# Patient Record
Sex: Male | Born: 1955 | Race: White | Hispanic: No | Marital: Married | Smoking: Never smoker
Health system: Southern US, Community
[De-identification: ages and names within clinical notes are randomized; demographics above are authoritative.]

## PROBLEM LIST (undated history)

## (undated) DIAGNOSIS — I10 Essential (primary) hypertension: Secondary | ICD-10-CM

## (undated) DIAGNOSIS — F419 Anxiety disorder, unspecified: Secondary | ICD-10-CM

## (undated) DIAGNOSIS — C9590 Leukemia, unspecified not having achieved remission: Secondary | ICD-10-CM

## (undated) DIAGNOSIS — E669 Obesity, unspecified: Secondary | ICD-10-CM

## (undated) DIAGNOSIS — I878 Other specified disorders of veins: Secondary | ICD-10-CM

## (undated) DIAGNOSIS — M1990 Unspecified osteoarthritis, unspecified site: Secondary | ICD-10-CM

## (undated) DIAGNOSIS — G4730 Sleep apnea, unspecified: Secondary | ICD-10-CM

## (undated) DIAGNOSIS — H9319 Tinnitus, unspecified ear: Secondary | ICD-10-CM

## (undated) DIAGNOSIS — M25569 Pain in unspecified knee: Secondary | ICD-10-CM

## (undated) DIAGNOSIS — R519 Headache, unspecified: Secondary | ICD-10-CM

## (undated) DIAGNOSIS — F32A Depression, unspecified: Secondary | ICD-10-CM

## (undated) DIAGNOSIS — R011 Cardiac murmur, unspecified: Secondary | ICD-10-CM

## (undated) DIAGNOSIS — J45909 Unspecified asthma, uncomplicated: Secondary | ICD-10-CM

## (undated) DIAGNOSIS — G8929 Other chronic pain: Secondary | ICD-10-CM

## (undated) HISTORY — DX: Pain in unspecified knee: M25.569

## (undated) HISTORY — DX: Other chronic pain: G89.29

## (undated) HISTORY — DX: Essential (primary) hypertension: I10

## (undated) HISTORY — PX: ROTATOR CUFF REPAIR: SHX139

## (undated) HISTORY — DX: Unspecified osteoarthritis, unspecified site: M19.90

## (undated) HISTORY — PX: FRACTURE SURGERY: SHX138

## (undated) HISTORY — DX: Obesity, unspecified: E66.9

## (undated) HISTORY — DX: Tinnitus, unspecified ear: H93.19

## (undated) HISTORY — DX: Leukemia, unspecified not having achieved remission: C95.90

## (undated) HISTORY — PX: ANAL FISSURE REPAIR: SHX2312

## (undated) HISTORY — DX: Other specified disorders of veins: I87.8

---

## 1995-02-26 DIAGNOSIS — C9590 Leukemia, unspecified not having achieved remission: Secondary | ICD-10-CM

## 1995-02-26 HISTORY — DX: Leukemia, unspecified not having achieved remission: C95.90

## 1996-02-26 DIAGNOSIS — M1990 Unspecified osteoarthritis, unspecified site: Secondary | ICD-10-CM

## 1996-02-26 HISTORY — DX: Unspecified osteoarthritis, unspecified site: M19.90

## 2000-06-19 ENCOUNTER — Ambulatory Visit (HOSPITAL_COMMUNITY): Admission: RE | Admit: 2000-06-19 | Discharge: 2000-06-19 | Payer: Self-pay | Admitting: Cardiology

## 2000-06-19 ENCOUNTER — Encounter: Payer: Self-pay | Admitting: Cardiology

## 2000-06-20 ENCOUNTER — Ambulatory Visit (HOSPITAL_COMMUNITY): Admission: RE | Admit: 2000-06-20 | Discharge: 2000-06-20 | Payer: Self-pay | Admitting: Cardiology

## 2000-08-08 ENCOUNTER — Ambulatory Visit (HOSPITAL_BASED_OUTPATIENT_CLINIC_OR_DEPARTMENT_OTHER): Admission: RE | Admit: 2000-08-08 | Discharge: 2000-08-08 | Payer: Self-pay | Admitting: Pulmonary Disease

## 2005-06-09 ENCOUNTER — Emergency Department (HOSPITAL_COMMUNITY): Admission: EM | Admit: 2005-06-09 | Discharge: 2005-06-09 | Payer: Self-pay | Admitting: Emergency Medicine

## 2010-05-25 ENCOUNTER — Encounter: Payer: Self-pay | Admitting: Internal Medicine

## 2010-05-25 ENCOUNTER — Encounter: Payer: Self-pay | Admitting: Family Medicine

## 2010-05-25 DIAGNOSIS — Z856 Personal history of leukemia: Secondary | ICD-10-CM | POA: Insufficient documentation

## 2010-05-25 DIAGNOSIS — C9590 Leukemia, unspecified not having achieved remission: Secondary | ICD-10-CM

## 2011-11-15 ENCOUNTER — Encounter: Payer: Self-pay | Admitting: Pulmonary Disease

## 2012-01-22 ENCOUNTER — Other Ambulatory Visit: Payer: Self-pay | Admitting: Sports Medicine

## 2012-01-22 DIAGNOSIS — M25519 Pain in unspecified shoulder: Secondary | ICD-10-CM

## 2012-01-28 ENCOUNTER — Other Ambulatory Visit: Payer: Self-pay | Admitting: Sports Medicine

## 2012-01-28 DIAGNOSIS — Z139 Encounter for screening, unspecified: Secondary | ICD-10-CM

## 2012-01-29 ENCOUNTER — Other Ambulatory Visit: Payer: Self-pay

## 2012-01-29 ENCOUNTER — Inpatient Hospital Stay: Admission: RE | Admit: 2012-01-29 | Payer: Self-pay | Source: Ambulatory Visit

## 2012-01-30 ENCOUNTER — Ambulatory Visit
Admission: RE | Admit: 2012-01-30 | Discharge: 2012-01-30 | Disposition: A | Discharge status: Home / Self Care | Payer: Managed Care, Other (non HMO) | Source: Ambulatory Visit | Attending: Sports Medicine | Admitting: Sports Medicine

## 2012-01-30 DIAGNOSIS — Z139 Encounter for screening, unspecified: Secondary | ICD-10-CM

## 2012-01-30 DIAGNOSIS — M25519 Pain in unspecified shoulder: Secondary | ICD-10-CM

## 2019-05-02 ENCOUNTER — Ambulatory Visit: Payer: Managed Care, Other (non HMO) | Attending: Internal Medicine

## 2019-05-02 DIAGNOSIS — Z23 Encounter for immunization: Secondary | ICD-10-CM | POA: Insufficient documentation

## 2019-05-02 NOTE — Progress Notes (Signed)
   Covid-19 Vaccination Clinic  Name:  VENTURA HELMES    MRN: [id number] DOB: 1956-02-09  05/02/2019  Mr. Savannah was observed post Covid-19 immunization for 15 minutes without incident. He was provided with Vaccine Information Sheet and instruction to access the V-Safe system.   Mr. Merenda was instructed to call 911 with any severe reactions post vaccine: Marland Kitchen Difficulty breathing  . Swelling of face and throat  . A fast heartbeat  . A bad rash all over body  . Dizziness and weakness   Immunizations Administered    Name Date Dose VIS Date Route   Pfizer COVID-19 Vaccine 05/02/2019  2:18 PM 0.3 mL 02/05/2019 Intramuscular   Manufacturer: [place]   Lot: [id number]   [place]: [id number]

## 2019-06-01 ENCOUNTER — Ambulatory Visit: Payer: Managed Care, Other (non HMO) | Attending: Internal Medicine

## 2019-06-01 DIAGNOSIS — Z23 Encounter for immunization: Secondary | ICD-10-CM

## 2019-06-01 NOTE — Progress Notes (Signed)
   Covid-19 Vaccination Clinic  Name:  Dakota Meyer    MRN: [id number] DOB: 1955-03-07  06/01/2019  [name] was observed post Covid-19 immunization for 15 minutes without incident. He was provided with Vaccine Information Sheet and instruction to access the V-Safe system.   [name] was instructed to call 911 with any severe reactions post vaccine: Marland Kitchen Difficulty breathing  . Swelling of face and throat  . A fast heartbeat  . A bad rash all over body  . Dizziness and weakness   Immunizations Administered    Name Date Dose VIS Date Route   Pfizer COVID-19 Vaccine 06/01/2019 12:39 PM 0.3 mL 02/05/2019 Intramuscular   Manufacturer: [place]   Lot: [id number]   [place]: [id number]

## 2019-10-27 ENCOUNTER — Other Ambulatory Visit: Payer: Self-pay | Admitting: Physician Assistant

## 2019-10-27 ENCOUNTER — Other Ambulatory Visit (HOSPITAL_COMMUNITY): Payer: Self-pay | Admitting: Physician Assistant

## 2019-10-27 DIAGNOSIS — Z808 Family history of malignant neoplasm of other organs or systems: Secondary | ICD-10-CM

## 2019-10-27 DIAGNOSIS — I878 Other specified disorders of veins: Secondary | ICD-10-CM

## 2019-11-03 ENCOUNTER — Ambulatory Visit (HOSPITAL_COMMUNITY)
Admission: RE | Admit: 2019-11-03 | Discharge: 2019-11-03 | Disposition: A | Discharge status: Home / Self Care | Payer: Medicare Other | Source: Ambulatory Visit | Attending: Physician Assistant | Admitting: Physician Assistant

## 2019-11-03 ENCOUNTER — Other Ambulatory Visit: Payer: Self-pay

## 2019-11-03 DIAGNOSIS — I878 Other specified disorders of veins: Secondary | ICD-10-CM | POA: Diagnosis not present

## 2019-11-03 DIAGNOSIS — I350 Nonrheumatic aortic (valve) stenosis: Secondary | ICD-10-CM | POA: Diagnosis not present

## 2019-11-03 DIAGNOSIS — I352 Nonrheumatic aortic (valve) stenosis with insufficiency: Secondary | ICD-10-CM

## 2019-11-03 LAB — ECHOCARDIOGRAM COMPLETE
AR max vel: 1.27 cm2
AV Area VTI: 1.21 cm2
AV Area mean vel: 1.27 cm2
AV Mean grad: 26 mmHg
AV Peak grad: 42.8 mmHg
Ao pk vel: 3.27 m/s
Area-P 1/2: 5.97 cm2
Single Plane A4C EF: 31.5 %

## 2019-11-03 NOTE — Progress Notes (Signed)
  Echocardiogram 2D Echocardiogram has been performed.  Dakota Meyer 11/03/2019, 2:34 PM

## 2019-11-09 ENCOUNTER — Encounter (HOSPITAL_COMMUNITY): Payer: Self-pay

## 2019-11-09 ENCOUNTER — Ambulatory Visit (HOSPITAL_COMMUNITY): Payer: Medicare Other

## 2020-02-01 ENCOUNTER — Other Ambulatory Visit: Payer: Self-pay | Admitting: Physician Assistant

## 2020-02-01 DIAGNOSIS — N644 Mastodynia: Secondary | ICD-10-CM

## 2020-02-20 NOTE — Progress Notes (Deleted)
Cardiology Office Note:    Date:  02/20/2020   ID:  Venson, Ferencz 1955/08/18, MRN [id number]  PCP:  Candi Leash, PA-C  Cardiologist:  No primary care provider on file.  Electrophysiologist:  None   Referring MD: Candi Leash, PA-C   No chief complaint on file. ***  History of Present Illness:    Dakota Meyer is a 64 y.o. male with a hx of chronic combined systolic and diastolic heart failure, aortic stenosis, hypertension who is referred by Candi Leash, PA for evaluation of heart failure.  Echocardiogram on 11/03/2019 showed LVEF 35 to 40%, global hypokinesis, grade 1 diastolic dysfunction, normal RV function, moderate aortic stenosis (AVA 1.2 cm, mean gradient 26 mmHg, Vmax 3.3 m/s).  Past Medical History:  Diagnosis Date  . Chronic knee pain   . Hypertension   . Leukemia ([place]) 1997  . Obesity   . Osteoarthritis 1998  . Tinnitus aurium   . Venous stasis     *** The histories are not reviewed yet. Please review them in the "History" navigator section and refresh this [place].  Current Medications: No outpatient medications have been marked as taking for the 02/24/20 encounter (Appointment) with Donato Heinz, MD.     Allergies:   Patient has no known allergies.   Social History   Socioeconomic History  . Marital status: Married    Spouse name: Not on file  . Number of children: Not on file  . Years of education: Not on file  . Highest education level: Not on file  Occupational History  . Not on file  Tobacco Use  . Smoking status: Never Smoker  . Smokeless tobacco: Not on file  Substance and Sexual Activity  . Alcohol use: Yes  . Drug use: No  . Sexual activity: Not on file  Other Topics Concern  . Not on file  Social History Narrative  . Not on file   Social Determinants of Health   Financial Resource Strain: Not on file  Food Insecurity: Not on file  Transportation Needs: Not on file  Physical Activity: Not on file   Stress: Not on file  Social Connections: Not on file     Family History: The patient's ***family history includes Aneurysm in his mother; Cancer in his father and mother.  ROS:   Please see the history of present illness.    *** All other systems reviewed and are negative.  EKGs/Labs/Other Studies Reviewed:    The following studies were reviewed today: ***  EKG:  EKG is *** ordered today.  The ekg ordered today demonstrates ***  Recent Labs: No results found for requested labs within last 8760 hours.  Recent Lipid Panel No results found for: CHOL, TRIG, HDL, CHOLHDL, VLDL, LDLCALC, LDLDIRECT  Physical Exam:    VS:  There were no vitals taken for this visit.    Wt Readings from Last 3 Encounters:  No data found for Wt     GEN: *** Well nourished, well developed in no acute distress HEENT: Normal NECK: No JVD; No carotid bruits LYMPHATICS: No lymphadenopathy CARDIAC: ***RRR, no murmurs, rubs, gallops RESPIRATORY:  Clear to auscultation without rales, wheezing or rhonchi  ABDOMEN: Soft, non-tender, non-distended MUSCULOSKELETAL:  No edema; No deformity  SKIN: Warm and dry NEUROLOGIC:  Alert and oriented x 3 PSYCHIATRIC:  Normal affect   ASSESSMENT:    No diagnosis found. PLAN:    Chronic combined systolic and diastolic heart failure:  Echocardiogram on 11/03/2019 showed LVEF  35 to 40%, global hypokinesis, grade 1 diastolic dysfunction, normal RV function, moderate AS.  Aortic stenosis: moderate aortic stenosis (AVA 1.2 cm, mean gradient 26 mmHg, Vmax 3.3 m/s) on echo 11/03/19.  Hypertension:  RTC in ***   Medication Adjustments/Labs and Tests Ordered: Current medicines are reviewed at length with the patient today.  Concerns regarding medicines are outlined above.  No orders of the defined types were placed in this encounter.  No orders of the defined types were placed in this encounter.   There are no Patient Instructions on file for this visit.    Signed, Donato Heinz, MD  02/20/2020 4:58 PM    [place]

## 2020-02-24 ENCOUNTER — Ambulatory Visit: Payer: Medicare Other | Admitting: Cardiology

## 2020-03-09 ENCOUNTER — Other Ambulatory Visit: Payer: Medicare Other

## 2020-03-09 ENCOUNTER — Inpatient Hospital Stay: Admission: RE | Admit: 2020-03-09 | Payer: Medicare Other | Source: Ambulatory Visit

## 2020-03-10 ENCOUNTER — Ambulatory Visit: Payer: Medicare Other | Admitting: Cardiology

## 2020-03-22 ENCOUNTER — Other Ambulatory Visit: Payer: Medicare Other

## 2020-03-26 NOTE — Progress Notes (Signed)
Cardiology Office Note:    Date:  03/27/2020   ID:  Dakota Meyer, Dakota Meyer 1955-12-28, MRN [id number]  PCP:  Candi Leash, PA-C  Cardiologist:  No primary care provider on file.  Electrophysiologist:  None   Referring MD: Candi Leash, PA-C   Chief Complaint  Patient presents with  . Congestive Heart Failure    History of Present Illness:    Dakota Meyer is a 65 y.o. male with a hx of leukemia, hypertension, hyperlipidemia, depression who is referred by Candi Leash, PA for evaluation of systolic heart failure.  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  He reports that he has been having dyspnea with minimal exertion.  Denies any chest pain.  Denies any lighthheadedness or syncope.  Does report he has been having lower extremity edema, uses compression stockings.  Reports rare palpitations where he feels like heart is fluttering.  Has been told he has OSA but does not use CPAP.  No smoking history.  Family history includes father had CABG in 71s.  Echocardiogram 11/03/2019 was poor quality study but showed LVEF 35 to [id number], grade 1 diastolic dysfunction, normal RV function, moderate aortic stenosis.  Past Medical History:  Diagnosis Date  . Chronic knee pain   . Hypertension   . Leukemia ([place]) 1997  . Obesity   . Osteoarthritis 1998  . Tinnitus aurium   . Venous stasis       Current Medications: Current Meds  Medication Sig  . amLODipine (NORVASC) 10 MG tablet Take 10 mg by mouth daily.  Marland Kitchen atorvastatin (LIPITOR) 20 MG tablet Take 20 mg by mouth at bedtime.  Marland Kitchen buPROPion (WELLBUTRIN XL) 150 MG 24 hr tablet Take 150 mg by mouth 2 (two) times daily.  . carvedilol (COREG) 6.25 MG tablet Take 1 tablet (6.25 mg total) by mouth 2 (two) times daily.  . cloNIDine (CATAPRES) 0.1 MG tablet Take 0.1 mg by mouth 2 (two) times daily.  . diazepam (VALIUM) 10 MG tablet 10 mg. 2 po qhs  . DULoxetine (CYMBALTA) 60 MG capsule Take 60 mg by mouth 2 (two) times daily.  .  hydrochlorothiazide (HYDRODIURIL) 25 MG tablet Take 25 mg by mouth every morning.  Marland Kitchen losartan (COZAAR) 100 MG tablet Take 100 mg by mouth daily.  . Omega-3 Fatty Acids (FISH OIL) 1200 MG CPDR Take by mouth.  . oxyCODONE (OXYCONTIN) 20 mg 12 hr tablet   . tamsulosin (FLOMAX) 0.4 MG CAPS capsule      Allergies:   Patient has no known allergies.   Social History   Socioeconomic History  . Marital status: Married    Spouse name: Not on file  . Number of children: Not on file  . Years of education: Not on file  . Highest education level: Not on file  Occupational History  . Not on file  Tobacco Use  . Smoking status: Never Smoker  . Smokeless tobacco: Never Used  Substance and Sexual Activity  . Alcohol use: Yes  . Drug use: No  . Sexual activity: Not on file  Other Topics Concern  . Not on file  Social History Narrative  . Not on file   Social Determinants of Health   Financial Resource Strain: Not on file  Food Insecurity: Not on file  Transportation Needs: Not on file  Physical Activity: Not on file  Stress: Not on file  Social Connections: Not on file     Family History: The patient's family history includes Aneurysm in  his mother; Cancer in his father and mother.  ROS:   Please see the history of present illness.     All other systems reviewed and are negative.  EKGs/Labs/Other Studies Reviewed:    The following studies were reviewed today:   EKG:  EKG is  ordered today.  The ekg ordered today demonstrates this sinus tachycardia, rate 101, LVH with repolarization abnormalities  Recent Labs: No results found for requested labs within last 8760 hours.  Recent Lipid Panel No results found for: CHOL, TRIG, HDL, CHOLHDL, VLDL, LDLCALC, LDLDIRECT  Physical Exam:    VS:  BP (!) 140/100   Pulse (!) 101   Ht 6\' 1"  (1.854 m)   Wt (!) 419 lb (190.1 kg)   SpO2 96%   BMI 55.28 kg/m     Wt Readings from Last 3 Encounters:  03/27/20 (!) 419 lb (190.1 kg)      GEN:  in no acute distress HEENT: Normal NECK: No JVD appreciated but difficult to assess given body habitus CARDIAC: RRR, 3 out of 6 systolic murmur loudest at RUSB RESPIRATORY:  Clear to auscultation without rales, wheezing or rhonchi  ABDOMEN: Soft, non-tender, non-distended MUSCULOSKELETAL:  No edema; No deformity  SKIN: Warm and dry NEUROLOGIC:  Alert and oriented x 3 PSYCHIATRIC:  Normal affect   ASSESSMENT:    1. Acute combined systolic and diastolic heart failure ([place])   2. Aortic valve stenosis, etiology of cardiac valve disease unspecified   3. Snoring   4. OSA (obstructive sleep apnea)   5. Essential hypertension   6. Hyperlipidemia, unspecified hyperlipidemia type    PLAN:    Acute combined systolic and diastolic heart failure: Echocardiogram 11/03/2019 was poor quality study but showed LVEF 35 to 33%, grade 1 diastolic dysfunction, normal RV function, moderate aortic stenosis.  He reports dyspnea with minimal exertion. -Echocardiogram appears to show systolic dysfunction, but very difficult images.  Recommend repeat limited echo with contrast to better evaluate systolic dysfunction.  If systolic dysfunction confirmed, will plan ischemia evaluation -Continue losartan 100 mg daily -BP elevated, will add carvedilol 6.25 mg twice daily -Check CMET, CBC, BNP, TSH -Schedule in pharmacy heart failure clinic in 2 weeks.  He is on multiple antihypertensives, will plan to transition these to GDMT  Aortic stenosis: Moderate on echo 11/03/2019, but poor quality study.  Plan repeat echo as above  OSA: Reports was diagnosed years ago but not on CPAP.  Suspect nontreated OSA contributing to resistant hypertension.  Will check sleep study  Hypertension: On losartan 100 mg daily, amlodipine 10 mg daily, clonidine 0.1 mg twice daily, HCTZ 25 mg daily.  -Suspect untreated OSA contributing to resistant hypertension, check sleep study as above. -Start Coreg 6.25 mg twice daily as  above -Check CMET  Hyperlipidemia: On atorvastatin 20 mg daily.  Will check lipid panel   RTC in 6 months   Medication Adjustments/Labs and Tests Ordered: Current medicines are reviewed at length with the patient today.  Concerns regarding medicines are outlined above.  Orders Placed This Encounter  Procedures  . EKG 12-Lead  . ECHOCARDIOGRAM LIMITED  . Split night study   Meds ordered this encounter  Medications  . carvedilol (COREG) 6.25 MG tablet    Sig: Take 1 tablet (6.25 mg total) by mouth 2 (two) times daily.    Dispense:  180 tablet    Refill:  3    Patient Instructions  Medication Instructions:  START carvedilol (Coreg) 6.25 mg two times daily  *If you  need a refill on your cardiac medications before your next appointment, please call your pharmacy*   Testing/Procedures: Your physician has requested that you have an echocardiogram (limited). Echocardiography is a painless test that uses sound waves to create images of your heart. It provides your doctor with information about the size and shape of your heart and how well your heart's chambers and valves are working. This procedure takes approximately one hour. There are no restrictions for this procedure. This will be done at our [facility] location:  [place] has recommended that you have a sleep study. This test records several body functions during sleep, including: brain activity, eye movement, oxygen and carbon dioxide blood levels, heart rate and rhythm, breathing rate and rhythm, the flow of air through your mouth and nose, snoring, body muscle movements, and chest and belly movement. --this must be approved by insurance prior to scheduling  Follow-Up: At [facility], you and your health needs are our priority.  As part of our continuing mission to provide you with exceptional heart care, we have created designated Provider Care Teams.  These Care Teams include your  primary Cardiologist (physician) and Advanced Practice Providers (APPs -  Physician Assistants and Nurse Practitioners) who all work together to provide you with the care you need, when you need it.  We recommend signing up for the patient portal called "MyChart".  Sign up information is provided on this After Visit Summary.  MyChart is used to connect with patients for Virtual Visits (Telemedicine).  Patients are able to view lab/test results, encounter notes, upcoming appointments, etc.  Non-urgent messages can be sent to your provider as well.   To learn more about what you can do with MyChart, go to [email or web address].    Your next appointment:   2 weeks with pharmacist-bring all medication with you  6 weeks with Dr. Gardiner Rhyme        Signed, Donato Heinz, MD  03/27/2020 6:10 PM    [place]

## 2020-03-27 ENCOUNTER — Ambulatory Visit: Payer: Medicare Other | Admitting: Cardiology

## 2020-03-27 ENCOUNTER — Telehealth: Payer: Self-pay | Admitting: *Deleted

## 2020-03-27 ENCOUNTER — Other Ambulatory Visit: Payer: Self-pay

## 2020-03-27 ENCOUNTER — Encounter: Payer: Self-pay | Admitting: Cardiology

## 2020-03-27 VITALS — BP 140/100 | HR 101 | Ht 73.0 in | Wt >= 6400 oz

## 2020-03-27 DIAGNOSIS — G4733 Obstructive sleep apnea (adult) (pediatric): Secondary | ICD-10-CM

## 2020-03-27 DIAGNOSIS — I350 Nonrheumatic aortic (valve) stenosis: Secondary | ICD-10-CM

## 2020-03-27 DIAGNOSIS — E785 Hyperlipidemia, unspecified: Secondary | ICD-10-CM

## 2020-03-27 DIAGNOSIS — R0683 Snoring: Secondary | ICD-10-CM | POA: Diagnosis not present

## 2020-03-27 DIAGNOSIS — I5041 Acute combined systolic (congestive) and diastolic (congestive) heart failure: Secondary | ICD-10-CM | POA: Diagnosis not present

## 2020-03-27 DIAGNOSIS — I10 Essential (primary) hypertension: Secondary | ICD-10-CM

## 2020-03-27 MED ORDER — CARVEDILOL 6.25 MG PO TABS: 6.2500 mg | ORAL_TABLET | Freq: Two times a day (BID) | ORAL | 3 refills | Status: DC

## 2020-03-27 NOTE — Patient Instructions (Signed)
Medication Instructions:  START carvedilol (Coreg) 6.25 mg two times daily  *If you need a refill on your cardiac medications before your next appointment, please call your pharmacy*   Testing/Procedures: Your physician has requested that you have an echocardiogram (limited). Echocardiography is a painless test that uses sound waves to create images of your heart. It provides your doctor with information about the size and shape of your heart and how well your heart's chambers and valves are working. This procedure takes approximately one hour. There are no restrictions for this procedure. This will be done at our [facility] location:  [place] has recommended that you have a sleep study. This test records several body functions during sleep, including: brain activity, eye movement, oxygen and carbon dioxide blood levels, heart rate and rhythm, breathing rate and rhythm, the flow of air through your mouth and nose, snoring, body muscle movements, and chest and belly movement. --this must be approved by insurance prior to scheduling  Follow-Up: At [facility], you and your health needs are our priority.  As part of our continuing mission to provide you with exceptional heart care, we have created designated Provider Care Teams.  These Care Teams include your primary Cardiologist (physician) and Advanced Practice Providers (APPs -  Physician Assistants and Nurse Practitioners) who all work together to provide you with the care you need, when you need it.  We recommend signing up for the patient portal called "MyChart".  Sign up information is provided on this After Visit Summary.  MyChart is used to connect with patients for Virtual Visits (Telemedicine).  Patients are able to view lab/test results, encounter notes, upcoming appointments, etc.  Non-urgent messages can be sent to your provider as well.   To learn more about what you can do with MyChart, go to  [email or web address].    Your next appointment:   2 weeks with pharmacist-bring all medication with you  6 weeks with Dr. Gardiner Rhyme

## 2020-03-27 NOTE — Telephone Encounter (Signed)
Left message to call back  Labs needed per Dr. Gardiner Rhyme (after OV 1/31)  CMET, CBC, BNP, TSH, Lipid panel-ordered placed.

## 2020-03-28 ENCOUNTER — Telehealth: Payer: Self-pay | Admitting: Cardiology

## 2020-03-28 NOTE — Telephone Encounter (Signed)
Left message to call back  

## 2020-03-28 NOTE — Telephone Encounter (Signed)
No PA Required °

## 2020-03-31 NOTE — Telephone Encounter (Signed)
Left message to call back  

## 2020-04-03 ENCOUNTER — Ambulatory Visit
Admission: RE | Admit: 2020-04-03 | Discharge: 2020-04-03 | Disposition: A | Discharge status: Home / Self Care | Payer: Medicare Other | Source: Ambulatory Visit | Attending: Physician Assistant | Admitting: Physician Assistant

## 2020-04-03 DIAGNOSIS — Z808 Family history of malignant neoplasm of other organs or systems: Secondary | ICD-10-CM

## 2020-04-10 ENCOUNTER — Other Ambulatory Visit: Payer: Self-pay

## 2020-04-10 ENCOUNTER — Ambulatory Visit (INDEPENDENT_AMBULATORY_CARE_PROVIDER_SITE_OTHER): Payer: Medicare Other | Admitting: Pharmacist

## 2020-04-10 DIAGNOSIS — I10 Essential (primary) hypertension: Secondary | ICD-10-CM

## 2020-04-10 DIAGNOSIS — I5020 Unspecified systolic (congestive) heart failure: Secondary | ICD-10-CM | POA: Diagnosis not present

## 2020-04-10 MED ORDER — ENTRESTO 49-51 MG PO TABS: 1.0000 | ORAL_TABLET | Freq: Two times a day (BID) | ORAL | 0 refills | Status: DC

## 2020-04-10 MED ORDER — CARVEDILOL 6.25 MG PO TABS
12.5000 mg | ORAL_TABLET | Freq: Two times a day (BID) | ORAL | 3 refills | Status: DC
Start: 2020-04-10 — End: 2020-05-23

## 2020-04-10 NOTE — Patient Instructions (Addendum)
Return for a  follow up appointment in 2 weeks  Go to the lab in [place] your blood pressure at home daily (if able) and keep record of the readings.  Take your BP meds as follows: *Decrease clonidine to 0.1 mg every day for 3 days, then 0.1mg  every other day for 3 days, then stop taking clonidine  *STOP taking losartan 100mg * *START taking Entresto 49-51mg  twice daily* *INCREASE carvedilol to 12.5mg  twice daily (okay to take 2 tablets of 6.25mg  until all gone)  Bring all of your meds, your BP cuff and your record of home blood pressures to your next appointment.  Exercise as you're able, try to walk approximately 30 minutes per day.  Keep salt intake to a minimum, especially watch canned and prepared boxed foods.  Eat more fresh fruits and vegetables and fewer canned items.  Avoid eating in fast food restaurants.    HOW TO TAKE YOUR BLOOD PRESSURE: . Rest 5 minutes before taking your blood pressure. .  Don't smoke or drink caffeinated beverages for at least 30 minutes before. . Take your blood pressure before (not after) you eat. . Sit comfortably with your back supported and both feet on the floor (don't cross your legs). . Elevate your arm to heart level on a table or a desk. . Use the proper sized cuff. It should fit smoothly and snugly around your bare upper arm. There should be enough room to slip a fingertip under the cuff. The bottom edge of the cuff should be 1 inch above the crease of the elbow. . Ideally, take 3 measurements at one sitting and record the average.

## 2020-04-10 NOTE — Progress Notes (Signed)
Patient ID: Dakota Meyer                 DOB: 1955/12/25                      MRN: [id number]     HPI: Dakota Meyer is a 65 y.o. male referred by Dr. Gardiner Rhyme to HTN clinic. PMH includes hypertension, hyperlipidemia, depression, OSA, leukemia, HFrEF, and venous stasis. Hypertension therapy initiated 3 years ago.  Noted chronic pain management. Current pain level 5/10 while in clinic.   Current HTN meds:  Amlodipine 10mg  daily  Carvedilol 6.25mg  twice daily Clonidine 0.1mg  twice daily HCTZ 25mg  daily losartan 100mg  daily  Intolerance: none   BP goal: <130/80  Family History: family history includes Aneurysm in his mother; Cancer in his father and mother. Father had CABG x4,   Social History: no smoking and not lots of alcohol  Diet: working on low sodium diet, working on depression as anxious eater.  Exercise: runner and football when young, very minimal physical activity at this time.  Home BP readings: none provided  Wt Readings from Last 3 Encounters:  03/27/20 (!) 419 lb (190.1 kg)   BP Readings from Last 3 Encounters:  04/10/20 132/86  03/27/20 (!) 140/100   Pulse Readings from Last 3 Encounters:  04/10/20 90  03/27/20 (!) 101    Past Medical History:  Diagnosis Date  . Chronic knee pain   . Hypertension   . Leukemia ([place]) 1997  . Obesity   . Osteoarthritis 1998  . Tinnitus aurium   . Venous stasis     Current Outpatient Medications on File Prior to Visit  Medication Sig Dispense Refill  . amLODipine (NORVASC) 10 MG tablet Take 10 mg by mouth daily.    Marland Kitchen atorvastatin (LIPITOR) 20 MG tablet Take 20 mg by mouth at bedtime.    Marland Kitchen buPROPion (WELLBUTRIN XL) 150 MG 24 hr tablet Take 150 mg by mouth 2 (two) times daily.    . Cod Liver Oil OIL Take by mouth daily.    . diazepam (VALIUM) 10 MG tablet 10 mg. 2 po qhs    . DULoxetine (CYMBALTA) 60 MG capsule Take 60 mg by mouth 2 (two) times daily.    . hydrochlorothiazide (HYDRODIURIL) 25 MG tablet Take 25 mg by  mouth every morning.    . Omega-3 Fatty Acids (FISH OIL) 1200 MG CPDR Take by mouth.    . oxyCODONE-acetaminophen (PERCOCET) 10-325 MG tablet Take 1 tablet by mouth every 4 (four) hours as needed for pain.    . tamsulosin (FLOMAX) 0.4 MG CAPS capsule Take 1 capsule by mouth daily.     No current facility-administered medications on file prior to visit.    No Known Allergies  Blood pressure 132/86, pulse 90, height 6\' 3"  (1.905 m), SpO2 93 %.  HFrEF (heart failure with reduced ejection fraction) (HCC) Blood pressure remains slightly above goal. Noted patient is on carvedilol, clonidine, losartan, and amlodipine. Medical history includes hypertension and HFrEF. Will wean-off clonidine, stop losartan, add ENTRESTO 49/51mg  to his regimen, and increase carvedilol to 12.5mg  BID. Plan to follow up in  2 weeks to continue medication titration. Goal is  to add spironolactone and Farxiga/Jardiance to therapy as needed.   Fabio Wah Rodriguez-Guzman PharmD, BCPS, [place] [address] 04/13/2020 4:38 PM

## 2020-04-11 LAB — COMPREHENSIVE METABOLIC PANEL
ALT: 12 IU/L (ref 0–44)
AST: 20 IU/L (ref 0–40)
Albumin/Globulin Ratio: 1.2 (ref 1.2–2.2)
Albumin: 3.8 g/dL (ref 3.8–4.8)
Alkaline Phosphatase: 99 IU/L (ref 44–121)
BUN/Creatinine Ratio: 21 (ref 10–24)
BUN: 19 mg/dL (ref 8–27)
Bilirubin Total: 0.3 mg/dL (ref 0.0–1.2)
CO2: 27 mmol/L (ref 20–29)
Calcium: 10 mg/dL (ref 8.6–10.2)
Chloride: 100 mmol/L (ref 96–106)
Creatinine, Ser: 0.9 mg/dL (ref 0.76–1.27)
GFR calc Af Amer: 103 mL/min/{1.73_m2} (ref >59)
GFR calc non Af Amer: 89 mL/min/{1.73_m2} (ref >59)
Globulin, Total: 3.2 g/dL (ref 1.5–4.5)
Glucose: 92 mg/dL (ref 65–99)
Potassium: 4.4 mmol/L (ref 3.5–5.2)
Sodium: 142 mmol/L (ref 134–144)
Total Protein: 7 g/dL (ref 6.0–8.5)

## 2020-04-11 LAB — LIPID PANEL
Chol/HDL Ratio: 4 ratio (ref 0.0–5.0)
Cholesterol, Total: 170 mg/dL (ref 100–199)
HDL: 43 mg/dL (ref >39)
LDL Chol Calc (NIH): 96 mg/dL (ref 0–99)
Triglycerides: 182 mg/dL — ABNORMAL HIGH (ref 0–149)
VLDL Cholesterol Cal: 31 mg/dL (ref 5–40)

## 2020-04-11 LAB — CBC
Hematocrit: 42.5 % (ref 37.5–51.0)
Hemoglobin: 14.2 g/dL (ref 13.0–17.7)
MCH: 30.9 pg (ref 26.6–33.0)
MCHC: 33.4 g/dL (ref 31.5–35.7)
MCV: 92 fL (ref 79–97)
Platelets: 259 10*3/uL (ref 150–450)
RBC: 4.6 x10E6/uL (ref 4.14–5.80)
RDW: 12.1 % (ref 11.6–15.4)
WBC: 6.9 10*3/uL (ref 3.4–10.8)

## 2020-04-11 LAB — BRAIN NATRIURETIC PEPTIDE: BNP: 99.7 pg/mL (ref 0.0–100.0)

## 2020-04-11 LAB — TSH: TSH: 1.87 u[IU]/mL (ref 0.450–4.500)

## 2020-04-13 ENCOUNTER — Encounter: Payer: Self-pay | Admitting: Pharmacist

## 2020-04-13 NOTE — Assessment & Plan Note (Addendum)
Blood pressure remains slightly above goal. Noted patient is on carvedilol, clonidine, losartan, and amlodipine. Medical history includes hypertension and HFrEF. Will wean-off clonidine, stop losartan, add ENTRESTO 49/51mg  to his regimen, and increase carvedilol to 12.5mg  BID. Plan to follow up in  2 weeks to continue medication titration. Goal is  to add spironolactone and Farxiga/Jardiance to therapy as needed.

## 2020-04-18 ENCOUNTER — Other Ambulatory Visit: Payer: Self-pay

## 2020-04-18 ENCOUNTER — Other Ambulatory Visit: Payer: Medicare Other

## 2020-04-18 ENCOUNTER — Ambulatory Visit (INDEPENDENT_AMBULATORY_CARE_PROVIDER_SITE_OTHER): Payer: Medicare Other | Admitting: Pharmacist Clinician (PhC)/ Clinical Pharmacy Specialist

## 2020-04-18 DIAGNOSIS — I5020 Unspecified systolic (congestive) heart failure: Secondary | ICD-10-CM

## 2020-04-18 MED ORDER — EPLERENONE 25 MG PO TABS: 12.5000 mg | ORAL_TABLET | Freq: Every day | ORAL | 3 refills | Status: DC

## 2020-04-18 MED ORDER — EMPAGLIFLOZIN 10 MG PO TABS: 10.0000 mg | ORAL_TABLET | Freq: Every day | ORAL | 0 refills | Status: DC

## 2020-04-18 MED ORDER — ENTRESTO 49-51 MG PO TABS: 1.0000 | ORAL_TABLET | Freq: Two times a day (BID) | ORAL | 6 refills | Status: DC

## 2020-04-18 NOTE — Assessment & Plan Note (Signed)
Patient with HFrEF currently on Entresto 49/51 and carvedilol 12.5 mg, both twice daily.   To get him fully on GDMT, will have him cut amlodipine back to 5 mg daily and add in eplerenone 12.5 mg once daily.  Will also add Jardiance 10 mg daily.  With his insurance he will have a $95 deductible, then pay $47/month for each Jardiance and Entresto.  He states this will be fine for now, but if he has difficulty with the costs, he should let us know ASAP.  He is scheduled to see Dr. Gardiner Rhyme in 3 weeks, so will have him repeat metabolic panel at that time.

## 2020-04-18 NOTE — Patient Instructions (Addendum)
Return for a a follow up appointment with Dr. Gardiner Rhyme on March 14  Go to the lab when you come in to see Dr. Gardiner Rhyme  Take your BP meds as follows:  Cut amlodipine in half from 10 mg daily to 5 mg daily  Start eplerenone 12.5 (1/2 of 25 mg tablet) once daily  Start Jardiance 10 mg once daily  Bring all of your meds, your BP cuff and your record of home blood pressures to your next appointment.  Exercise as you're able, try to walk approximately 30 minutes per day.  Keep salt intake to a minimum, especially watch canned and prepared boxed foods.  Eat more fresh fruits and vegetables and fewer canned items.  Avoid eating in fast food restaurants.    HOW TO TAKE YOUR BLOOD PRESSURE: . Rest 5 minutes before taking your blood pressure. .  Don't smoke or drink caffeinated beverages for at least 30 minutes before. . Take your blood pressure before (not after) you eat. . Sit comfortably with your back supported and both feet on the floor (don't cross your legs). . Elevate your arm to heart level on a table or a desk. . Use the proper sized cuff. It should fit smoothly and snugly around your bare upper arm. There should be enough room to slip a fingertip under the cuff. The bottom edge of the cuff should be 1 inch above the crease of the elbow. . Ideally, take 3 measurements at one sitting and record the average.

## 2020-04-18 NOTE — Progress Notes (Signed)
Patient ID: PHARAOH PIO                 DOB: 15-Nov-1955                      MRN: [id number]     HPI: Dakota Meyer is a 65 y.o. male referred by Dr. Gardiner Rhyme to HTN clinic. PMH includes hypertension, hyperlipidemia, depression, OSA, leukemia, HFrEF, and venous stasis. Hypertension therapy initiated 3 years ago.  Noted chronic pain management, now using percocet 10/325 due to insurance issues with prior oxycontin use.  He states that he will take up to 6 tablets per day, as currently he has problems with osteoarthritis in both knees as well as a strained clavicle and bad rotator cuff.    Today we discussed his HF diagnosis and the goal of getting him on GDMT.  In talking about using spironolactone, he does note that he is scheduled for a mammogram in the near future because of nipple tenderness.    Current HTN meds:  Amlodipine 10 mg daily  Carvedilol 12.5 mg twice daily HCTZ 25 mg daily Entresto 49/51 mg bid  Intolerance: none   BP goal: <130/80  Family History: family history includes Aneurysm in his mother; Cancer in his father and mother. Father had CABG x4,   Social History: no smoking and not lots of alcohol  Diet: working on low sodium diet, working on depression as anxious eater.  Exercise: runner and football when young, very minimal physical activity at this time.  Home BP readings: none provided  Wt Readings from Last 3 Encounters:  03/27/20 (!) 419 lb (190.1 kg)   BP Readings from Last 3 Encounters:  04/18/20 110/80  04/10/20 132/86  03/27/20 (!) 140/100   Pulse Readings from Last 3 Encounters:  04/18/20 91  04/10/20 90  03/27/20 (!) 101    Past Medical History:  Diagnosis Date  . Chronic knee pain   . Hypertension   . Leukemia ([place]) 1997  . Obesity   . Osteoarthritis 1998  . Tinnitus aurium   . Venous stasis     Current Outpatient Medications on File Prior to Visit  Medication Sig Dispense Refill  . amLODipine (NORVASC) 10 MG tablet Take 10 mg  by mouth daily.    Marland Kitchen atorvastatin (LIPITOR) 20 MG tablet Take 20 mg by mouth at bedtime.    Marland Kitchen buPROPion (WELLBUTRIN XL) 150 MG 24 hr tablet Take 150 mg by mouth 2 (two) times daily.    . carvedilol (COREG) 6.25 MG tablet Take 2 tablets (12.5 mg total) by mouth 2 (two) times daily. 180 tablet 3  . Cod Liver Oil OIL Take by mouth daily.    . diazepam (VALIUM) 10 MG tablet 10 mg. 2 po qhs    . hydrochlorothiazide (HYDRODIURIL) 25 MG tablet Take 25 mg by mouth every morning.    . Omega-3 Fatty Acids (FISH OIL) 1200 MG CPDR Take by mouth.    . oxyCODONE-acetaminophen (PERCOCET) 10-325 MG tablet Take 1 tablet by mouth every 4 (four) hours as needed for pain.    . tamsulosin (FLOMAX) 0.4 MG CAPS capsule Take 1 capsule by mouth daily.     No current facility-administered medications on file prior to visit.    No Known Allergies  Blood pressure 110/80, pulse 91, resp. rate 18, height 6\' 1"  (1.854 m), SpO2 94 %.  HFrEF (heart failure with reduced ejection fraction) ([place]) Patient with HFrEF currently on Entresto 49/51 and  carvedilol 12.5 mg, both twice daily.   To get him fully on GDMT, will have him cut amlodipine back to 5 mg daily and add in eplerenone 12.5 mg once daily.  Will also add Jardiance 10 mg daily.  With his insurance he will have a $95 deductible, then pay $47/month for each Jardiance and Entresto.  He states this will be fine for now, but if he has difficulty with the costs, he should let us know ASAP.  He is scheduled to see Dr. Gardiner Rhyme in 3 weeks, so will have him repeat metabolic panel at that time.     Tommy Medal PharmD CPP [facility] [address] [place] [address] 04/18/2020 4:14 PM

## 2020-04-20 ENCOUNTER — Ambulatory Visit (HOSPITAL_COMMUNITY): Payer: Medicare Other

## 2020-05-08 ENCOUNTER — Ambulatory Visit: Payer: Medicare Other | Admitting: Cardiology

## 2020-05-11 ENCOUNTER — Encounter (HOSPITAL_BASED_OUTPATIENT_CLINIC_OR_DEPARTMENT_OTHER): Payer: Medicare Other | Admitting: Cardiovascular Disease

## 2020-05-12 ENCOUNTER — Ambulatory Visit
Admission: RE | Admit: 2020-05-12 | Discharge: 2020-05-12 | Disposition: A | Discharge status: Home / Self Care | Payer: Medicare Other | Source: Ambulatory Visit | Attending: Physician Assistant | Admitting: Physician Assistant

## 2020-05-12 ENCOUNTER — Ambulatory Visit: Payer: Medicare Other

## 2020-05-12 ENCOUNTER — Other Ambulatory Visit: Payer: Self-pay

## 2020-05-12 DIAGNOSIS — N644 Mastodynia: Secondary | ICD-10-CM

## 2020-05-16 ENCOUNTER — Ambulatory Visit (HOSPITAL_COMMUNITY): Payer: Medicare Other | Attending: Cardiovascular Disease

## 2020-05-16 ENCOUNTER — Other Ambulatory Visit: Payer: Self-pay

## 2020-05-16 ENCOUNTER — Other Ambulatory Visit: Payer: Self-pay | Admitting: Cardiology

## 2020-05-16 DIAGNOSIS — I5041 Acute combined systolic (congestive) and diastolic (congestive) heart failure: Secondary | ICD-10-CM | POA: Diagnosis not present

## 2020-05-16 DIAGNOSIS — I350 Nonrheumatic aortic (valve) stenosis: Secondary | ICD-10-CM

## 2020-05-16 LAB — ECHOCARDIOGRAM COMPLETE
AR max vel: 1.89 cm2
AV Area VTI: 1.89 cm2
AV Area mean vel: 1.8 cm2
AV Mean grad: 36 mmHg
AV Peak grad: 61.5 mmHg
Ao pk vel: 3.92 m/s
Area-P 1/2: 4.15 cm2
S' Lateral: 4.4 cm

## 2020-05-16 MED ORDER — PERFLUTREN LIPID MICROSPHERE
1.0000 mL | INTRAVENOUS | Status: AC | PRN
  Administered 2020-05-16: 1 mL via INTRAVENOUS

## 2020-05-17 ENCOUNTER — Telehealth: Payer: Self-pay | Admitting: Cardiology

## 2020-05-17 NOTE — Telephone Encounter (Signed)
Returned call to patient-aware of echo results and will keep appt 3/25.  He states he is also out of Jardiance, has been out for 2 weeks.     Will send rx

## 2020-05-17 NOTE — Telephone Encounter (Signed)
Patient states he was returning phone call to get results

## 2020-05-18 NOTE — Telephone Encounter (Signed)
Well.. part of the problem is that we want to know renal function while on current therapy including Entresto,  Jardiance and eplerenoe.  Patient should repeat BMET as soon as possible to check Scr and potassium level while on Entresto and Eplerenoe.    Okay to send refill for Jardiance and he will need another BMET 2 weeks after resuming Jardiance therapy.

## 2020-05-19 ENCOUNTER — Other Ambulatory Visit: Payer: Self-pay

## 2020-05-19 ENCOUNTER — Ambulatory Visit: Payer: Medicare Other | Admitting: Cardiology

## 2020-05-19 ENCOUNTER — Encounter: Payer: Self-pay | Admitting: Cardiology

## 2020-05-19 VITALS — BP 110/72 | HR 82 | Ht 73.0 in | Wt >= 6400 oz

## 2020-05-19 DIAGNOSIS — I10 Essential (primary) hypertension: Secondary | ICD-10-CM

## 2020-05-19 DIAGNOSIS — I350 Nonrheumatic aortic (valve) stenosis: Secondary | ICD-10-CM | POA: Diagnosis not present

## 2020-05-19 DIAGNOSIS — Z01812 Encounter for preprocedural laboratory examination: Secondary | ICD-10-CM

## 2020-05-19 DIAGNOSIS — G4733 Obstructive sleep apnea (adult) (pediatric): Secondary | ICD-10-CM | POA: Diagnosis not present

## 2020-05-19 DIAGNOSIS — I5042 Chronic combined systolic (congestive) and diastolic (congestive) heart failure: Secondary | ICD-10-CM | POA: Diagnosis not present

## 2020-05-19 DIAGNOSIS — I5020 Unspecified systolic (congestive) heart failure: Secondary | ICD-10-CM

## 2020-05-19 MED ORDER — ASPIRIN EC 81 MG PO TBEC: 81.0000 mg | DELAYED_RELEASE_TABLET | Freq: Every day | ORAL | 3 refills | Status: DC

## 2020-05-19 MED ORDER — AMLODIPINE BESYLATE 10 MG PO TABS
5.0000 mg | ORAL_TABLET | Freq: Every day | ORAL | 3 refills | Status: DC
Start: 2020-05-19 — End: 2021-10-24

## 2020-05-19 NOTE — Telephone Encounter (Signed)
OV today with Dr. Gardiner Rhyme, will address in office

## 2020-05-19 NOTE — Progress Notes (Addendum)
Cardiology Office Note:    Date:  05/21/2020   ID:  Dakota Meyer, Dakota Meyer 1956/01/13, MRN [id number]  PCP:  Candi Leash, PA-C  Cardiologist:  No primary care provider on file.  Electrophysiologist:  None   Referring MD: Candi Leash, PA-C   Chief Complaint  Patient presents with  . Congestive Heart Failure    History of Present Illness:    Dakota Meyer is a 65 y.o. male with a hx of chronic combined systolic and diastolic heart failure, aortic stenosis, leukemia, hypertension, hyperlipidemia, depression who presents for follow-up.  He was referred by Candi Leash, PA for evaluation of systolic heart failure, initially seen on 03/27/2020.  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  He reports that he has been having dyspnea with minimal exertion.  Denies any chest pain.  Denies any lighthheadedness or syncope.  Does report he has been having lower extremity edema, uses compression stockings.  Reports rare palpitations where he feels like heart is fluttering.  Has been told he has OSA but does not use CPAP.  No smoking history.  Family history includes father had CABG in 56s.  Echocardiogram 11/03/2019 was poor quality study but showed LVEF 35 to 81%, grade 1 diastolic dysfunction, normal RV function, moderate aortic stenosis.  Echocardiogram on 05/16/2020 showed LVEF 30 to 27%, grade 1 diastolic dysfunction, normal RV function, mild AI, moderate to severe AS (Vmax 4.3 m/s, MG 37mmHg, AVA 1.6 cm^2).  Since last clinic visit, he denies any chest pain or shortness of breath.  Does report an episode of lightheadedness with standing, denies any syncope.  Reports has been having some lower extremity edema.  Weight is down 14 pounds from last clinic visit.  Reports he stopped drinking alcohol.  He denies any palpitations.   Wt Readings from Last 3 Encounters:  05/19/20 (!) 405 lb 9.6 oz (184 kg)  03/27/20 (!) 419 lb (190.1 kg)     Past Medical History:  Diagnosis Date  . Chronic knee  pain   . Hypertension   . Leukemia ([place]) 1997  . Obesity   . Osteoarthritis 1998  . Tinnitus aurium   . Venous stasis       Current Medications: Current Meds  Medication Sig  . aspirin EC 81 MG tablet Take 1 tablet (81 mg total) by mouth daily. Swallow whole.  Marland Kitchen atorvastatin (LIPITOR) 20 MG tablet Take 20 mg by mouth at bedtime.  Marland Kitchen buPROPion (WELLBUTRIN XL) 150 MG 24 hr tablet Take 150 mg by mouth 2 (two) times daily.  . carvedilol (COREG) 6.25 MG tablet Take 2 tablets (12.5 mg total) by mouth 2 (two) times daily.  . Cod Liver Oil OIL Take by mouth daily.  . diazepam (VALIUM) 10 MG tablet 10 mg. 2 po qhs  . empagliflozin (JARDIANCE) 10 MG TABS tablet Take 1 tablet (10 mg total) by mouth daily before breakfast.  . eplerenone (INSPRA) 25 MG tablet Take 0.5 tablets (12.5 mg total) by mouth daily.  . hydrochlorothiazide (HYDRODIURIL) 25 MG tablet Take 25 mg by mouth every morning.  . Omega-3 Fatty Acids (FISH OIL) 1200 MG CPDR Take by mouth.  . oxyCODONE-acetaminophen (PERCOCET) 10-325 MG tablet Take 1 tablet by mouth every 4 (four) hours as needed for pain.  . sacubitril-valsartan (ENTRESTO) 49-51 MG Take 1 tablet by mouth 2 (two) times daily.  . tamsulosin (FLOMAX) 0.4 MG CAPS capsule Take 1 capsule by mouth daily.  . [DISCONTINUED] amLODipine (NORVASC) 10 MG tablet Take 10  mg by mouth daily.     Allergies:   Patient has no known allergies.   Social History   Socioeconomic History  . Marital status: Married    Spouse name: Not on file  . Number of children: Not on file  . Years of education: Not on file  . Highest education level: Not on file  Occupational History  . Not on file  Tobacco Use  . Smoking status: Never Smoker  . Smokeless tobacco: Never Used  Substance and Sexual Activity  . Alcohol use: Yes  . Drug use: No  . Sexual activity: Not on file  Other Topics Concern  . Not on file  Social History Narrative  . Not on file   Social Determinants of Health    Financial Resource Strain: Not on file  Food Insecurity: Not on file  Transportation Needs: Not on file  Physical Activity: Not on file  Stress: Not on file  Social Connections: Not on file     Family History: The patient's family history includes Aneurysm in his mother; Cancer in his father and mother.  ROS:   Please see the history of present illness.     All other systems reviewed and are negative.  EKGs/Labs/Other Studies Reviewed:    The following studies were reviewed today:   EKG:  EKG is  ordered today.  The ekg ordered today demonstrates this sinus rhythm, rate 82, LVH with repolarization abnormalities  , rate 101, LVH with repolarization abnormalities  Recent Labs: 04/10/2020: ALT 12; BNP 99.7; TSH 1.870 05/19/2020: BUN 22; Creatinine, Ser 1.12; Hemoglobin 13.3; Magnesium 1.9; Platelets 215; Potassium 4.0; Sodium 141  Recent Lipid Panel    Component Value Date/Time   CHOL 170 04/10/2020 1558   TRIG 182 (H) 04/10/2020 1558   HDL 43 04/10/2020 1558   CHOLHDL 4.0 04/10/2020 1558   LDLCALC 96 04/10/2020 1558    Physical Exam:    VS:  BP 110/72   Pulse 82   Ht 6\' 1"  (1.854 m)   Wt (!) 405 lb 9.6 oz (184 kg)   SpO2 98%   BMI 53.51 kg/m     Wt Readings from Last 3 Encounters:  05/19/20 (!) 405 lb 9.6 oz (184 kg)  03/27/20 (!) 419 lb (190.1 kg)     GEN:  in no acute distress HEENT: Normal NECK: No JVD appreciated but difficult to assess given body habitus CARDIAC: RRR, 3 out of 6 systolic murmur loudest at RUSB RESPIRATORY:  Clear to auscultation without rales, wheezing or rhonchi  ABDOMEN: Soft, non-tender, non-distended MUSCULOSKELETAL:  No edema; No deformity  SKIN: Warm and dry NEUROLOGIC:  Alert and oriented x 3 PSYCHIATRIC:  Normal affect   ASSESSMENT:    1. Chronic combined systolic (congestive) and diastolic (congestive) heart failure ([place])   2. Aortic valve stenosis, etiology of cardiac valve disease unspecified   3. Essential  hypertension   4. OSA (obstructive sleep apnea)   5. Pre-procedure lab exam    PLAN:    Chronic combined systolic and diastolic heart failure: Echocardiogram 11/03/2019 was poor quality study but showed LVEF 35 to 17%, grade 1 diastolic dysfunction, normal RV function, moderate aortic stenosis.  He reports dyspnea with minimal exertion.  Repeat echocardiogram was done on 05/16/2020, with contrast given, and showed LVEF 30 to 35%, normal RV function, moderate to severe aortic stenosis. -Continue Entresto 49-51 mg twice daily.  Will check BMP, mag -Continue carvedilol 12.5 mg twice daily -Continue eplerenone 12.5 mg daily -Decrease  amlodipine to 5 mg daily, would favor weaning off amlodipine and HCTZ to continue to add GDMT.   -Recommend LHC/RHC.  Risks and benefits of cardiac catheterization have been discussed with the patient.  These include bleeding, infection, kidney damage, stroke, heart attack, death.  The patient understands these risks and is willing to proceed.  Aortic stenosis: Moderate on echo 11/03/2019, but poor quality study.  Repeat echo 05/16/20 showed moderate to severe AS (Vmax 4.3 m/s, MG 67mmHg, AVA 1.6 cm^2).  Suspect AVA is overestimated as LVOT diameter is measured at 2.7cm; at LVOT diameter 2.0 cm and VTI 24cm, AVA would be 0.9 cm^2.  Suspect severe AS. -Refer to valve clinic  OSA: Reports was diagnosed years ago but not on CPAP.  Suspect nontreated OSA contributing to resistant hypertension.  Will check sleep study  Hypertension: On Entresto, carvedilol, amlodipine, HCTZ.  Appears controlled.   -Suspect untreated OSA contributing to resistant hypertension, check sleep study as above. -Plan to transition from amlodipine and HCTZ to GDMT as above  Hyperlipidemia: On atorvastatin 20 mg daily.  LDL 96 on 04/10/2020   RTC in 6 months   Medication Adjustments/Labs and Tests Ordered: Current medicines are reviewed at length with the patient today.  Concerns regarding medicines  are outlined above.  Orders Placed This Encounter  Procedures  . Basic metabolic panel  . CBC  . Magnesium  . Ambulatory referral to Structural Heart/Valve Clinic (only at [place])  . Home sleep test   Meds ordered this encounter  Medications  . amLODipine (NORVASC) 10 MG tablet    Sig: Take 0.5 tablets (5 mg total) by mouth daily.    Dispense:  30 tablet    Refill:  3  . aspirin EC 81 MG tablet    Sig: Take 1 tablet (81 mg total) by mouth daily. Swallow whole.    Dispense:  90 tablet    Refill:  3    Patient Instructions  Medication Instructions:  DECREASE amlodipine to 5 mg daily START aspirin 81 mg daily  We will see lab results prior to restarting Jardiance.   *If you need a refill on your cardiac medications before your next appointment, please call your pharmacy*   Lab Work: BMET, CBC, Mag today  If you have labs (blood work) drawn today and your tests are completely normal, you will receive your results only by: Marland Kitchen MyChart Message (if you have MyChart) OR . A paper copy in the mail If you have any lab test that is abnormal or we need to change your treatment, we will call you to review the results.   Testing/Procedures: Home sleep study  Your physician has requested that you have a cardiac catheterization. Cardiac catheterization is used to diagnose and/or treat various heart conditions. Doctors may recommend this procedure for a number of different reasons. The most common reason is to evaluate chest pain. Chest pain can be a symptom of coronary artery disease (CAD), and cardiac catheterization can show whether plaque is narrowing or blocking your heart's arteries. This procedure is also used to evaluate the valves, as well as measure the blood flow and oxygen levels in different parts of your heart. For further information please visit HugeFiesta.tn. Please follow instruction sheet, as given.  Follow-Up: At [facility], you and your health needs are  our priority.  As part of our continuing mission to provide you with exceptional heart care, we have created designated Provider Care Teams.  These Care Teams include your primary  Cardiologist (physician) and Advanced Practice Providers (APPs -  Physician Assistants and Nurse Practitioners) who all work together to provide you with the care you need, when you need it.  We recommend signing up for the patient portal called "MyChart".  Sign up information is provided on this After Visit Summary.  MyChart is used to connect with patients for Virtual Visits (Telemedicine).  Patients are able to view lab/test results, encounter notes, upcoming appointments, etc.  Non-urgent messages can be sent to your provider as well.   To learn more about what you can do with MyChart, go to [email or web address].    Your next appointment:   1 month(s)  The format for your next appointment:   In Person  Provider:   Oswaldo Milian, MD   Other Instructions You have been referred to: Structural Heart/Valve [place] [address] Dept: [place]: [place]  05/19/2020  You are scheduled for a Cardiac Catheterization on Thursday, March 31 with Dr. Sherren Mocha.  1. Please arrive at the [facility] (Main Entrance A) at [facility] [address] [place], [place] [address] at 10:30 AM (This time is two hours before your procedure to ensure your preparation). Free valet parking service is available.   Special note: Every effort is made to have your procedure done on time. Please understand that emergencies sometimes delay scheduled procedures.  2. Diet: Do not eat solid foods after midnight.  The patient may have clear liquids until 5am upon the day of the procedure.  3. Labs: today in office  You will need a COVID-19  test prior to  your procedure. You are scheduled for Monday 3/28 at 2:20 PM. This is a Drive Up Visit at [address] [place], [place] [address]. Someone will direct you to the appropriate testing line. Stay in your car and someone will be with you shortly.  4. Medication instructions in preparation for your procedure:   Contrast Allergy: No  Hold HCTZ and Jardiance AM of procedure   On the morning of your procedure, take your Aspirin and any morning medicines NOT listed above.  You may use sips of water.  5. Plan for one night stay--bring personal belongings. 6. Bring a current list of your medications and current insurance cards. 7. You MUST have a responsible person to drive you home. 8. Someone MUST be with you the first 24 hours after you arrive home or your discharge will be delayed. 9. Please wear clothes that are easy to get on and off and wear slip-on shoes.  Thank you for allowing [place] to care for you!   -- [facility] Invasive Cardiovascular services     Signed, Donato Heinz, MD  05/21/2020 3:02 PM    [facility]

## 2020-05-19 NOTE — Patient Instructions (Addendum)
Medication Instructions:  DECREASE amlodipine to 5 mg daily START aspirin 81 mg daily  We will see lab results prior to restarting Jardiance.   *If you need a refill on your cardiac medications before your next appointment, please call your pharmacy*   Lab Work: BMET, CBC, Mag today  If you have labs (blood work) drawn today and your tests are completely normal, you will receive your results only by: Marland Kitchen MyChart Message (if you have MyChart) OR . A paper copy in the mail If you have any lab test that is abnormal or we need to change your treatment, we will call you to review the results.   Testing/Procedures: Home sleep study  Your physician has requested that you have a cardiac catheterization. Cardiac catheterization is used to diagnose and/or treat various heart conditions. Doctors may recommend this procedure for a number of different reasons. The most common reason is to evaluate chest pain. Chest pain can be a symptom of coronary artery disease (CAD), and cardiac catheterization can show whether plaque is narrowing or blocking your heart's arteries. This procedure is also used to evaluate the valves, as well as measure the blood flow and oxygen levels in different parts of your heart. For further information please visit HugeFiesta.tn. Please follow instruction sheet, as given.  Follow-Up: At [facility], you and your health needs are our priority.  As part of our continuing mission to provide you with exceptional heart care, we have created designated Provider Care Teams.  These Care Teams include your primary Cardiologist (physician) and Advanced Practice Providers (APPs -  Physician Assistants and Nurse Practitioners) who all work together to provide you with the care you need, when you need it.  We recommend signing up for the patient portal called "MyChart".  Sign up information is provided on this After Visit Summary.  MyChart is used to connect with patients for Virtual  Visits (Telemedicine).  Patients are able to view lab/test results, encounter notes, upcoming appointments, etc.  Non-urgent messages can be sent to your provider as well.   To learn more about what you can do with MyChart, go to [email or web address].    Your next appointment:   1 month(s)  The format for your next appointment:   In Person  Provider:   Oswaldo Milian, MD   Other Instructions You have been referred to: Structural Heart/Valve [place] [address] Dept: [place]: [place]  05/19/2020  You are scheduled for a Cardiac Catheterization on Thursday, March 31 with Dr. Sherren Mocha.  1. Please arrive at the [facility] (Main Entrance A) at [facility]: [address] [place], [place] [address] at 10:30 AM (This time is two hours before your procedure to ensure your preparation). Free valet parking service is available.   Special note: Every effort is made to have your procedure done on time. Please understand that emergencies sometimes delay scheduled procedures.  2. Diet: Do not eat solid foods after midnight.  The patient may have clear liquids until 5am upon the day of the procedure.  3. Labs: today in office  You will need a COVID-19  test prior to your procedure. You are scheduled for Monday 3/28 at 2:20 PM. This is a Drive Up Visit at [address] [place], [place] [address]. Someone will direct you to the appropriate testing line. Stay  in your car and someone will be with you shortly.  4. Medication instructions in preparation for your procedure:   Contrast Allergy: No  Hold HCTZ and Jardiance AM of procedure   On the morning of your procedure, take your Aspirin and any morning medicines NOT listed above.  You may use sips of water.  5. Plan for one night stay--bring personal  belongings. 6. Bring a current list of your medications and current insurance cards. 7. You MUST have a responsible person to drive you home. 8. Someone MUST be with you the first 24 hours after you arrive home or your discharge will be delayed. 9. Please wear clothes that are easy to get on and off and wear slip-on shoes.  Thank you for allowing [place] to care for you!   --  Invasive Cardiovascular services

## 2020-05-19 NOTE — H&P (View-Only) (Signed)
Cardiology Office Note:    Date:  05/21/2020   ID:  Dakota, Meyer [birth year], MRN [id number]  PCP:  Candi Leash, PA-C  Cardiologist:  No primary care provider on file.  Electrophysiologist:  None   Referring MD: Candi Leash, PA-C   Chief Complaint  Patient presents with  . Congestive Heart Failure    History of Present Illness:    Dakota Meyer is a 65 y.o. male with a hx of chronic combined systolic and diastolic heart failure, aortic stenosis, leukemia, hypertension, hyperlipidemia, depression who presents for follow-up.  He was referred by Candi Leash, PA for evaluation of systolic heart failure, initially seen on 03/27/2020.  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  He reports that he has been having dyspnea with minimal exertion.  Denies any chest pain.  Denies any lighthheadedness or syncope.  Does report he has been having lower extremity edema, uses compression stockings.  Reports rare palpitations where he feels like heart is fluttering.  Has been told he has OSA but does not use CPAP.  No smoking history.  Family history includes father had CABG in 91s.  Echocardiogram 11/03/2019 was poor quality study but showed LVEF 35 to 23%, grade 1 diastolic dysfunction, normal RV function, moderate aortic stenosis.  Echocardiogram on 05/16/2020 showed LVEF 30 to 55%, grade 1 diastolic dysfunction, normal RV function, mild AI, moderate to severe AS (Vmax 4.3 m/s, MG 63mmHg, AVA 1.6 cm^2).  Since last clinic visit, he denies any chest pain or shortness of breath.  Does report an episode of lightheadedness with standing, denies any syncope.  Reports has been having some lower extremity edema.  Weight is down 14 pounds from last clinic visit.  Reports he stopped drinking alcohol.  He denies any palpitations.   Wt Readings from Last 3 Encounters:  05/19/20 (!) 405 lb 9.6 oz (184 kg)  03/27/20 (!) 419 lb (190.1 kg)     Past Medical History:  Diagnosis Date  . Chronic knee  pain   . Hypertension   . Leukemia ([place]) 1997  . Obesity   . Osteoarthritis 1998  . Tinnitus aurium   . Venous stasis       Current Medications: Current Meds  Medication Sig  . aspirin EC 81 MG tablet Take 1 tablet (81 mg total) by mouth daily. Swallow whole.  Marland Kitchen atorvastatin (LIPITOR) 20 MG tablet Take 20 mg by mouth at bedtime.  Marland Kitchen buPROPion (WELLBUTRIN XL) 150 MG 24 hr tablet Take 150 mg by mouth 2 (two) times daily.  . carvedilol (COREG) 6.25 MG tablet Take 2 tablets (12.5 mg total) by mouth 2 (two) times daily.  . Cod Liver Oil OIL Take by mouth daily.  . diazepam (VALIUM) 10 MG tablet 10 mg. 2 po qhs  . empagliflozin (JARDIANCE) 10 MG TABS tablet Take 1 tablet (10 mg total) by mouth daily before breakfast.  . eplerenone (INSPRA) 25 MG tablet Take 0.5 tablets (12.5 mg total) by mouth daily.  . hydrochlorothiazide (HYDRODIURIL) 25 MG tablet Take 25 mg by mouth every morning.  . Omega-3 Fatty Acids (FISH OIL) 1200 MG CPDR Take by mouth.  . oxyCODONE-acetaminophen (PERCOCET) 10-325 MG tablet Take 1 tablet by mouth every 4 (four) hours as needed for pain.  . sacubitril-valsartan (ENTRESTO) 49-51 MG Take 1 tablet by mouth 2 (two) times daily.  . tamsulosin (FLOMAX) 0.4 MG CAPS capsule Take 1 capsule by mouth daily.  . [DISCONTINUED] amLODipine (NORVASC) 10 MG tablet Take 10  mg by mouth daily.     Allergies:   Patient has no known allergies.   Social History   Socioeconomic History  . Marital status: Married    Spouse name: Not on file  . Number of children: Not on file  . Years of education: Not on file  . Highest education level: Not on file  Occupational History  . Not on file  Tobacco Use  . Smoking status: Never Smoker  . Smokeless tobacco: Never Used  Substance and Sexual Activity  . Alcohol use: Yes  . Drug use: No  . Sexual activity: Not on file  Other Topics Concern  . Not on file  Social History Narrative  . Not on file   Social Determinants of Health    Financial Resource Strain: Not on file  Food Insecurity: Not on file  Transportation Needs: Not on file  Physical Activity: Not on file  Stress: Not on file  Social Connections: Not on file     Family History: The patient's family history includes Aneurysm in his mother; Cancer in his father and mother.  ROS:   Please see the history of present illness.     All other systems reviewed and are negative.  EKGs/Labs/Other Studies Reviewed:    The following studies were reviewed today:   EKG:  EKG is  ordered today.  The ekg ordered today demonstrates this sinus rhythm, rate 82, LVH with repolarization abnormalities  , rate 101, LVH with repolarization abnormalities  Recent Labs: 04/10/2020: ALT 12; BNP 99.7; TSH 1.870 05/19/2020: BUN 22; Creatinine, Ser 1.12; Hemoglobin 13.3; Magnesium 1.9; Platelets 215; Potassium 4.0; Sodium 141  Recent Lipid Panel    Component Value Date/Time   CHOL 170 04/10/2020 1558   TRIG 182 (H) 04/10/2020 1558   HDL 43 04/10/2020 1558   CHOLHDL 4.0 04/10/2020 1558   LDLCALC 96 04/10/2020 1558    Physical Exam:    VS:  BP 110/72   Pulse 82   Ht 6\' 1"  (1.854 m)   Wt (!) 405 lb 9.6 oz (184 kg)   SpO2 98%   BMI 53.51 kg/m     Wt Readings from Last 3 Encounters:  05/19/20 (!) 405 lb 9.6 oz (184 kg)  03/27/20 (!) 419 lb (190.1 kg)     GEN:  in no acute distress HEENT: Normal NECK: No JVD appreciated but difficult to assess given body habitus CARDIAC: RRR, 3 out of 6 systolic murmur loudest at RUSB RESPIRATORY:  Clear to auscultation without rales, wheezing or rhonchi  ABDOMEN: Soft, non-tender, non-distended MUSCULOSKELETAL:  No edema; No deformity  SKIN: Warm and dry NEUROLOGIC:  Alert and oriented x 3 PSYCHIATRIC:  Normal affect   ASSESSMENT:    1. Chronic combined systolic (congestive) and diastolic (congestive) heart failure ([place])   2. Aortic valve stenosis, etiology of cardiac valve disease unspecified   3. Essential  hypertension   4. OSA (obstructive sleep apnea)   5. Pre-procedure lab exam    PLAN:    Chronic combined systolic and diastolic heart failure: Echocardiogram 11/03/2019 was poor quality study but showed LVEF 35 to 57%, grade 1 diastolic dysfunction, normal RV function, moderate aortic stenosis.  He reports dyspnea with minimal exertion.  Repeat echocardiogram was done on 05/16/2020, with contrast given, and showed LVEF 30 to 35%, normal RV function, moderate to severe aortic stenosis. -Continue Entresto 49-51 mg twice daily.  Will check BMP, mag -Continue carvedilol 12.5 mg twice daily -Continue eplerenone 12.5 mg daily -Decrease  amlodipine to 5 mg daily, would favor weaning off amlodipine and HCTZ to continue to add GDMT.   -Recommend LHC/RHC.  Risks and benefits of cardiac catheterization have been discussed with the patient.  These include bleeding, infection, kidney damage, stroke, heart attack, death.  The patient understands these risks and is willing to proceed.  Aortic stenosis: Moderate on echo 11/03/2019, but poor quality study.  Repeat echo 05/16/20 showed moderate to severe AS (Vmax 4.3 m/s, MG 33mmHg, AVA 1.6 cm^2).  Suspect AVA is overestimated as LVOT diameter is measured at 2.7cm; at LVOT diameter 2.0 cm and VTI 24cm, AVA would be 0.9 cm^2.  Suspect severe AS. -Refer to valve clinic  OSA: Reports was diagnosed years ago but not on CPAP.  Suspect nontreated OSA contributing to resistant hypertension.  Will check sleep study  Hypertension: On Entresto, carvedilol, amlodipine, HCTZ.  Appears controlled.   -Suspect untreated OSA contributing to resistant hypertension, check sleep study as above. -Plan to transition from amlodipine and HCTZ to GDMT as above  Hyperlipidemia: On atorvastatin 20 mg daily.  LDL 96 on 04/10/2020   RTC in 6 months   Medication Adjustments/Labs and Tests Ordered: Current medicines are reviewed at length with the patient today.  Concerns regarding medicines  are outlined above.  Orders Placed This Encounter  Procedures  . Basic metabolic panel  . CBC  . Magnesium  . Ambulatory referral to Structural [facility]Valve Clinic (only at [place])  . Home sleep test   Meds ordered this encounter  Medications  . amLODipine (NORVASC) 10 MG tablet    Sig: Take 0.5 tablets (5 mg total) by mouth daily.    Dispense:  30 tablet    Refill:  3  . aspirin EC 81 MG tablet    Sig: Take 1 tablet (81 mg total) by mouth daily. Swallow whole.    Dispense:  90 tablet    Refill:  3    Patient Instructions  Medication Instructions:  DECREASE amlodipine to 5 mg daily START aspirin 81 mg daily  We will see lab results prior to restarting Jardiance.   *If you need a refill on your cardiac medications before your next appointment, please call your pharmacy*   Lab Work: BMET, CBC, Mag today  If you have labs (blood work) drawn today and your tests are completely normal, you will receive your results only by: Marland Kitchen MyChart Message (if you have MyChart) OR . A paper copy in the mail If you have any lab test that is abnormal or we need to change your treatment, we will call you to review the results.   Testing/Procedures: Home sleep study  Your physician has requested that you have a cardiac catheterization. Cardiac catheterization is used to diagnose and/or treat various heart conditions. Doctors may recommend this procedure for a number of different reasons. The most common reason is to evaluate chest pain. Chest pain can be a symptom of coronary artery disease (CAD), and cardiac catheterization can show whether plaque is narrowing or blocking your heart's arteries. This procedure is also used to evaluate the valves, as well as measure the blood flow and oxygen levels in different parts of your heart. For further information please visit [email or web address].[email or web address]. Please follow instruction sheet, as given.  Follow-Up: At [facility], you and your health needs are  our priority.  As part of our continuing mission to provide you with exceptional heart care, we have created designated Provider Care Teams.  These Care Teams include your primary  Cardiologist (physician) and Advanced Practice Providers (APPs -  Physician Assistants and Nurse Practitioners) who all work together to provide you with the care you need, when you need it.  We recommend signing up for the patient portal called "MyChart".  Sign up information is provided on this After Visit Summary.  MyChart is used to connect with patients for Virtual Visits (Telemedicine).  Patients are able to view lab/test results, encounter notes, upcoming appointments, etc.  Non-urgent messages can be sent to your provider as well.   To learn more about what you can do with MyChart, go to [email or web address].    Your next appointment:   1 month(s)  The format for your next appointment:   In Person  Provider:   Oswaldo Milian, MD   Other Instructions You have been referred to: Structural Heart/Valve [address] [place] [address] Dept: [place]: [place]  05/19/2020  You are scheduled for a Cardiac Catheterization on Thursday, March 31 with Dr. Sherren Mocha.  1. Please arrive at the [facility] (Main Entrance A) at [facility]: [address] [place], [place] [address] at 10:30 AM (This time is two hours before your procedure to ensure your preparation). Free valet parking service is available.   Special note: Every effort is made to have your procedure done on time. Please understand that emergencies sometimes delay scheduled procedures.  2. Diet: Do not eat solid foods after midnight.  The patient may have clear liquids until 5am upon the day of the procedure.  3. Labs: today in office  You will need a COVID-19  test prior to  your procedure. You are scheduled for Monday 3/28 at 2:20 PM. This is a Drive Up Visit at [address] [place],  [address]. Someone will direct you to the appropriate testing line. Stay in your car and someone will be with you shortly.  4. Medication instructions in preparation for your procedure:   Contrast Allergy: No  Hold HCTZ and Jardiance AM of procedure   On the morning of your procedure, take your Aspirin and any morning medicines NOT listed above.  You may use sips of water.  5. Plan for one night stay--bring personal belongings. 6. Bring a current list of your medications and current insurance cards. 7. You MUST have a responsible person to drive you home. 8. Someone MUST be with you the first 24 hours after you arrive home or your discharge will be delayed. 9. Please wear clothes that are easy to get on and off and wear slip-on shoes.  Thank you for allowing [place] to care for you!   -- [facility] Invasive Cardiovascular services     Signed, Donato Heinz, MD  05/21/2020 3:02 PM    [facility]

## 2020-05-20 LAB — BASIC METABOLIC PANEL
BUN/Creatinine Ratio: 20 (ref 10–24)
BUN: 22 mg/dL (ref 8–27)
CO2: 27 mmol/L (ref 20–29)
Calcium: 9.3 mg/dL (ref 8.6–10.2)
Chloride: 100 mmol/L (ref 96–106)
Creatinine, Ser: 1.12 mg/dL (ref 0.76–1.27)
Glucose: 97 mg/dL (ref 65–99)
Potassium: 4 mmol/L (ref 3.5–5.2)
Sodium: 141 mmol/L (ref 134–144)
eGFR: 72 mL/min/{1.73_m2} (ref >59)

## 2020-05-20 LAB — CBC
Hematocrit: 38.3 % (ref 37.5–51.0)
Hemoglobin: 13.3 g/dL (ref 13.0–17.7)
MCH: 31.6 pg (ref 26.6–33.0)
MCHC: 34.7 g/dL (ref 31.5–35.7)
MCV: 91 fL (ref 79–97)
Platelets: 215 10*3/uL (ref 150–450)
RBC: 4.21 x10E6/uL (ref 4.14–5.80)
RDW: 11.8 % (ref 11.6–15.4)
WBC: 6.1 10*3/uL (ref 3.4–10.8)

## 2020-05-20 LAB — MAGNESIUM: Magnesium: 1.9 mg/dL (ref 1.6–2.3)

## 2020-05-22 ENCOUNTER — Other Ambulatory Visit (HOSPITAL_COMMUNITY): Payer: Medicare Other

## 2020-05-23 ENCOUNTER — Telehealth: Payer: Self-pay | Admitting: *Deleted

## 2020-05-23 ENCOUNTER — Telehealth: Payer: Self-pay | Admitting: Cardiology

## 2020-05-23 ENCOUNTER — Other Ambulatory Visit: Payer: Self-pay

## 2020-05-23 ENCOUNTER — Other Ambulatory Visit (HOSPITAL_COMMUNITY)
Admission: RE | Admit: 2020-05-23 | Discharge: 2020-05-23 | Disposition: A | Discharge status: Home / Self Care | Payer: Medicare Other | Source: Ambulatory Visit | Attending: Cardiovascular Disease | Admitting: Cardiovascular Disease

## 2020-05-23 DIAGNOSIS — Z20822 Contact with and (suspected) exposure to covid-19: Secondary | ICD-10-CM | POA: Diagnosis not present

## 2020-05-23 DIAGNOSIS — Z01812 Encounter for preprocedural laboratory examination: Secondary | ICD-10-CM | POA: Insufficient documentation

## 2020-05-23 LAB — SARS CORONAVIRUS 2 (TAT 6-24 HRS): SARS Coronavirus 2: NEGATIVE

## 2020-05-23 MED ORDER — CARVEDILOL 12.5 MG PO TABS
12.5000 mg | ORAL_TABLET | Freq: Two times a day (BID) | ORAL | 1 refills | Status: DC
Start: 2020-05-23 — End: 2021-10-24

## 2020-05-23 MED ORDER — CARVEDILOL 12.5 MG PO TABS
12.5000 mg | ORAL_TABLET | Freq: Two times a day (BID) | ORAL | 1 refills | Status: DC
Start: 2020-05-23 — End: 2020-05-23

## 2020-05-23 NOTE — Telephone Encounter (Signed)
Called patient- per note from MD. Continuing Carvedilol 12.5 mg twice daily.   Patient verbalized understanding. He asked if we could adjust the RX to stay 12.5 mg twice daily- instead of the 6.25 take 2 tablets twice daily. I did update RX and sent to pharmacy.   Will route to RN to make aware of change.   Thanks!

## 2020-05-23 NOTE — Telephone Encounter (Signed)
New Message:    Pt wants to know if his Carvedilol dose will stay the same or will it be changed?

## 2020-05-23 NOTE — Telephone Encounter (Signed)
Pt contacted pre-catheterization scheduled at [facility] for: Thursday May 25, 2020 12:30 PM Verified arrival time and place: [facility]) at: 10:30 AM   No solid food after midnight prior to cath, clear liquids until 5 AM day of procedure.  Hold: [place]-AM of procedure Jardiance-AM of procedure HCTZ-AM of procedure Inspra-AM of procedure  Except hold medications AM meds can be  taken pre-cath with sips of water including: ASA 81 mg   Confirmed patient has responsible adult to drive home post procedure and be with patient first 24 hours after arriving home: yes  You are allowed ONE visitor in the waiting room during the time you are at the hospital for your procedure. Both you and your visitor must wear a mask once you enter the hospital.    Reviewed procedure/mask/visitor instructions with patient.

## 2020-05-24 ENCOUNTER — Telehealth: Payer: Self-pay | Admitting: *Deleted

## 2020-05-24 NOTE — Telephone Encounter (Signed)
Left HST appointment details on VM. 

## 2020-05-25 ENCOUNTER — Ambulatory Visit (HOSPITAL_COMMUNITY)
Admission: RE | Disposition: A | Discharge status: Home / Self Care | Payer: Self-pay | Source: Home / Self Care | Attending: Cardiovascular Disease

## 2020-05-25 ENCOUNTER — Other Ambulatory Visit: Payer: Self-pay

## 2020-05-25 ENCOUNTER — Ambulatory Visit (HOSPITAL_COMMUNITY)
Admission: RE | Admit: 2020-05-25 | Discharge: 2020-05-25 | Disposition: A | Discharge status: Home / Self Care | Payer: Medicare Other | Attending: Cardiovascular Disease | Admitting: Cardiovascular Disease

## 2020-05-25 DIAGNOSIS — I350 Nonrheumatic aortic (valve) stenosis: Secondary | ICD-10-CM

## 2020-05-25 DIAGNOSIS — G4733 Obstructive sleep apnea (adult) (pediatric): Secondary | ICD-10-CM | POA: Insufficient documentation

## 2020-05-25 DIAGNOSIS — I5042 Chronic combined systolic (congestive) and diastolic (congestive) heart failure: Secondary | ICD-10-CM | POA: Diagnosis not present

## 2020-05-25 DIAGNOSIS — E785 Hyperlipidemia, unspecified: Secondary | ICD-10-CM | POA: Insufficient documentation

## 2020-05-25 DIAGNOSIS — Z79899 Other long term (current) drug therapy: Secondary | ICD-10-CM | POA: Diagnosis not present

## 2020-05-25 DIAGNOSIS — I110 Hypertensive heart disease with heart failure: Secondary | ICD-10-CM | POA: Diagnosis not present

## 2020-05-25 DIAGNOSIS — Z7982 Long term (current) use of aspirin: Secondary | ICD-10-CM | POA: Insufficient documentation

## 2020-05-25 DIAGNOSIS — Z7984 Long term (current) use of oral hypoglycemic drugs: Secondary | ICD-10-CM | POA: Insufficient documentation

## 2020-05-25 HISTORY — PX: RIGHT/LEFT HEART CATH AND CORONARY ANGIOGRAPHY: CATH118266

## 2020-05-25 LAB — POCT I-STAT 7, (LYTES, BLD GAS, ICA,H+H)
Acid-Base Excess: 5 mmol/L — ABNORMAL HIGH (ref 0.0–2.0)
Bicarbonate: 30.9 mmol/L — ABNORMAL HIGH (ref 20.0–28.0)
Calcium, Ion: 1.29 mmol/L (ref 1.15–1.40)
HCT: 35 % — ABNORMAL LOW (ref 39.0–52.0)
Hemoglobin: 11.9 g/dL — ABNORMAL LOW (ref 13.0–17.0)
O2 Saturation: 99 %
Potassium: 3.7 mmol/L (ref 3.5–5.1)
Sodium: 140 mmol/L (ref 135–145)
TCO2: 32 mmol/L (ref 22–32)
pCO2 arterial: 51.7 mmHg — ABNORMAL HIGH (ref 32.0–48.0)
pH, Arterial: 7.384 (ref 7.350–7.450)
pO2, Arterial: 125 mmHg — ABNORMAL HIGH (ref 83.0–108.0)

## 2020-05-25 LAB — POCT I-STAT EG7
Acid-Base Excess: 5 mmol/L — ABNORMAL HIGH (ref 0.0–2.0)
Acid-Base Excess: 5 mmol/L — ABNORMAL HIGH (ref 0.0–2.0)
Bicarbonate: 30.5 mmol/L — ABNORMAL HIGH (ref 20.0–28.0)
Bicarbonate: 31.4 mmol/L — ABNORMAL HIGH (ref 20.0–28.0)
Calcium, Ion: 1.23 mmol/L (ref 1.15–1.40)
Calcium, Ion: 1.25 mmol/L (ref 1.15–1.40)
HCT: 35 % — ABNORMAL LOW (ref 39.0–52.0)
HCT: 35 % — ABNORMAL LOW (ref 39.0–52.0)
Hemoglobin: 11.9 g/dL — ABNORMAL LOW (ref 13.0–17.0)
Hemoglobin: 11.9 g/dL — ABNORMAL LOW (ref 13.0–17.0)
O2 Saturation: 77 %
O2 Saturation: 79 %
Potassium: 3.7 mmol/L (ref 3.5–5.1)
Potassium: 3.7 mmol/L (ref 3.5–5.1)
Sodium: 141 mmol/L (ref 135–145)
Sodium: 141 mmol/L (ref 135–145)
TCO2: 32 mmol/L (ref 22–32)
TCO2: 33 mmol/L — ABNORMAL HIGH (ref 22–32)
pCO2, Ven: 50.7 mmHg (ref 44.0–60.0)
pCO2, Ven: 52 mmHg (ref 44.0–60.0)
pH, Ven: 7.388 (ref 7.250–7.430)
pH, Ven: 7.388 (ref 7.250–7.430)
pO2, Ven: 43 mmHg (ref 32.0–45.0)
pO2, Ven: 44 mmHg (ref 32.0–45.0)

## 2020-05-25 SURGERY — RIGHT/LEFT HEART CATH AND CORONARY ANGIOGRAPHY
Anesthesia: LOCAL

## 2020-05-25 MED ORDER — LIDOCAINE HCL (PF) 1 % IJ SOLN
INTRAMUSCULAR | Status: DC | PRN
  Administered 2020-05-25 (×2): 2 mL

## 2020-05-25 MED ORDER — IOHEXOL 350 MG/ML SOLN
INTRAVENOUS | Status: DC | PRN
  Administered 2020-05-25: 50 mL

## 2020-05-25 MED ORDER — SODIUM CHLORIDE 0.9 % IV SOLN: INTRAVENOUS | Status: DC

## 2020-05-25 MED ORDER — LABETALOL HCL 5 MG/ML IV SOLN: 10.0000 mg | INTRAVENOUS | Status: DC | PRN

## 2020-05-25 MED ORDER — FENTANYL CITRATE (PF) 100 MCG/2ML IJ SOLN
INTRAMUSCULAR | Status: DC | PRN
  Administered 2020-05-25 (×2): 25 ug via INTRAVENOUS

## 2020-05-25 MED ORDER — HEPARIN SODIUM (PORCINE) 1000 UNIT/ML IJ SOLN
INTRAMUSCULAR | Status: AC
  Filled 2020-05-25: qty 1

## 2020-05-25 MED ORDER — ACETAMINOPHEN 325 MG PO TABS: 650.0000 mg | ORAL_TABLET | ORAL | Status: DC | PRN

## 2020-05-25 MED ORDER — HEPARIN SODIUM (PORCINE) 1000 UNIT/ML IJ SOLN
INTRAMUSCULAR | Status: DC | PRN
  Administered 2020-05-25: 8000 [IU] via INTRAVENOUS

## 2020-05-25 MED ORDER — HEPARIN (PORCINE) IN NACL 1000-0.9 UT/500ML-% IV SOLN
INTRAVENOUS | Status: AC
  Filled 2020-05-25: qty 1000

## 2020-05-25 MED ORDER — ASPIRIN 81 MG PO CHEW: 81.0000 mg | CHEWABLE_TABLET | ORAL | Status: DC

## 2020-05-25 MED ORDER — SODIUM CHLORIDE 0.9 % IV SOLN
250.0000 mL | INTRAVENOUS | Status: DC | PRN
Start: 2020-05-25 — End: 2020-05-26

## 2020-05-25 MED ORDER — HYDRALAZINE HCL 20 MG/ML IJ SOLN: 10.0000 mg | INTRAMUSCULAR | Status: DC | PRN

## 2020-05-25 MED ORDER — SODIUM CHLORIDE 0.9 % IV SOLN: 250.0000 mL | INTRAVENOUS | Status: DC | PRN

## 2020-05-25 MED ORDER — LIDOCAINE HCL (PF) 1 % IJ SOLN
INTRAMUSCULAR | Status: AC
  Filled 2020-05-25: qty 30

## 2020-05-25 MED ORDER — HEPARIN (PORCINE) IN NACL 1000-0.9 UT/500ML-% IV SOLN
INTRAVENOUS | Status: DC | PRN
  Administered 2020-05-25 (×2): 500 mL

## 2020-05-25 MED ORDER — VERAPAMIL HCL 2.5 MG/ML IV SOLN
INTRAVENOUS | Status: AC
  Filled 2020-05-25: qty 2

## 2020-05-25 MED ORDER — ONDANSETRON HCL 4 MG/2ML IJ SOLN: 4.0000 mg | Freq: Four times a day (QID) | INTRAMUSCULAR | Status: DC | PRN

## 2020-05-25 MED ORDER — MIDAZOLAM HCL 2 MG/2ML IJ SOLN
INTRAMUSCULAR | Status: AC
  Filled 2020-05-25: qty 2

## 2020-05-25 MED ORDER — FENTANYL CITRATE (PF) 100 MCG/2ML IJ SOLN
INTRAMUSCULAR | Status: AC
  Filled 2020-05-25: qty 2

## 2020-05-25 MED ORDER — SODIUM CHLORIDE 0.9% FLUSH
3.0000 mL | INTRAVENOUS | Status: DC | PRN
Start: 2020-05-25 — End: 2020-05-26

## 2020-05-25 MED ORDER — SODIUM CHLORIDE 0.9% FLUSH: 3.0000 mL | Freq: Two times a day (BID) | INTRAVENOUS | Status: DC

## 2020-05-25 MED ORDER — VERAPAMIL HCL 2.5 MG/ML IV SOLN
INTRAVENOUS | Status: DC | PRN
  Administered 2020-05-25: 10 mL via INTRA_ARTERIAL

## 2020-05-25 MED ORDER — MIDAZOLAM HCL 2 MG/2ML IJ SOLN
INTRAMUSCULAR | Status: DC | PRN
  Administered 2020-05-25 (×2): 1 mg via INTRAVENOUS

## 2020-05-25 SURGICAL SUPPLY — 13 items
CATH 5FR JL3.5 JR4 ANG PIG MP (CATHETERS) ×1 IMPLANT
CATH SWAN GANZ 7F STRAIGHT (CATHETERS) ×1 IMPLANT
DEVICE RAD COMP TR BAND LRG (VASCULAR PRODUCTS) ×1 IMPLANT
GLIDESHEATH SLEND SS 6F .021 (SHEATH) ×1 IMPLANT
GLIDESHEATH SLENDER 7FR .021G (SHEATH) ×1 IMPLANT
GUIDEWIRE ANGLED .035X150CM (WIRE) ×1 IMPLANT
GUIDEWIRE INQWIRE 1.5J.035X260 (WIRE) IMPLANT
INQWIRE 1.5J .035X260CM (WIRE) ×2
KIT HEART LEFT (KITS) ×2 IMPLANT
PACK CARDIAC CATHETERIZATION (CUSTOM PROCEDURE TRAY) ×2 IMPLANT
SHEATH PROBE COVER 6X72 (BAG) ×1 IMPLANT
TRANSDUCER W/STOPCOCK (MISCELLANEOUS) ×2 IMPLANT
TUBING CIL FLEX 10 FLL-RA (TUBING) ×2 IMPLANT

## 2020-05-25 NOTE — Addendum Note (Signed)
Addended by: Lubertha Sayres on: 05/25/2020 11:25 AM   Modules accepted: Orders

## 2020-05-25 NOTE — Discharge Instructions (Signed)
Radial Site Care  This sheet gives you information about how to care for yourself after your procedure. Your health care provider may also give you more specific instructions. If you have problems or questions, contact your health care provider. What can I expect after the procedure? After the procedure, it is common to have:  Bruising and tenderness at the catheter insertion area. Follow these instructions at home: Medicines  Take over-the-counter and prescription medicines only as told by your health care provider. Insertion site care  Follow instructions from your health care provider about how to take care of your insertion site. Make sure you: ? Wash your hands with soap and water before you change your bandage (dressing). If soap and water are not available, use hand sanitizer. ? Change your dressing as told by your health care provider. ? Leave stitches (sutures), skin glue, or adhesive strips in place. These skin closures may need to stay in place for 2 weeks or longer. If adhesive strip edges start to loosen and curl up, you may trim the loose edges. Do not remove adhesive strips completely unless your health care provider tells you to do that.  Check your insertion site every day for signs of infection. Check for: ? Redness, swelling, or pain. ? Fluid or blood. ? Pus or a bad smell. ? Warmth.  Do not take baths, swim, or use a hot tub until your health care provider approves.  You may shower 24-48 hours after the procedure, or as directed by your health care provider. ? Remove the dressing and gently wash the site with plain soap and water. ? Pat the area dry with a clean towel. ? Do not rub the site. That could cause bleeding.  Do not apply powder or lotion to the site. Activity  For 24 hours after the procedure, or as directed by your health care provider: ? Do not flex or bend the affected arm. ? Do not push or pull heavy objects with the affected arm. ? Do not drive  yourself home from the hospital or clinic. You may drive 24 hours after the procedure unless your health care provider tells you not to. ? Do not operate machinery or power tools.  Do not lift anything that is heavier than 10 lb (4.5 kg), or the limit that you are told, until your health care provider says that it is safe.  Ask your health care provider when it is okay to: ? Return to work or school. ? Resume usual physical activities or sports. ? Resume sexual activity.   General instructions  If the catheter site starts to bleed, raise your arm and put firm pressure on the site. If the bleeding does not stop, get help right away. This is a medical emergency.  If you went home on the same day as your procedure, a responsible adult should be with you for the first 24 hours after you arrive home.  Keep all follow-up visits as told by your health care provider. This is important. Contact a health care provider if:  You have a fever.  You have redness, swelling, or yellow drainage around your insertion site. Get help right away if:  You have unusual pain at the radial site.  The catheter insertion area swells very fast.  The insertion area is bleeding, and the bleeding does not stop when you hold steady pressure on the area.  Your arm or hand becomes pale, cool, tingly, or numb. These symptoms may represent a serious   problem that is an emergency. Do not wait to see if the symptoms will go away. Get medical help right away. Call your local emergency services (911 in the [place] Do not drive yourself to the hospital. Summary  After the procedure, it is common to have bruising and tenderness at the site.  Follow instructions from your health care provider about how to take care of your radial site wound. Check the wound every day for signs of infection.  Do not lift anything that is heavier than 10 lb (4.5 kg), or the limit that you are told, until your health care provider says that it  is safe. This information is not intended to replace advice given to you by your health care provider. Make sure you discuss any questions you have with your health care provider. Document Revised: 03/19/2017 Document Reviewed: 03/19/2017 Elsevier Patient Education  2021 [employer]vier Inc.  

## 2020-05-25 NOTE — Interval H&P Note (Signed)
History and Physical Interval Note:  05/25/2020 1:31 PM  Dakota Meyer  has presented today for surgery, with the diagnosis of heart failure - aortic stenosis - possible pre TAVR.  The various methods of treatment have been discussed with the patient and family. After consideration of risks, benefits and other options for treatment, the patient has consented to  Procedure(s): RIGHT/LEFT HEART CATH AND CORONARY ANGIOGRAPHY (N/A) as a surgical intervention.  The patient's history has been reviewed, patient examined, no change in status, stable for surgery.  I have reviewed the patient's chart and labs.  Questions were answered to the patient's satisfaction.     Sherren Mocha

## 2020-05-26 ENCOUNTER — Encounter (HOSPITAL_COMMUNITY): Payer: Self-pay | Admitting: Cardiovascular Disease

## 2020-05-26 MED FILL — Bupivacaine HCl Preservative Free (PF) Inj 0.25%: INTRAMUSCULAR | Qty: 30 | Status: AC

## 2020-06-01 ENCOUNTER — Other Ambulatory Visit (HOSPITAL_COMMUNITY): Payer: Self-pay | Admitting: Emergency Medicine

## 2020-06-01 DIAGNOSIS — I10 Essential (primary) hypertension: Secondary | ICD-10-CM

## 2020-06-01 MED ORDER — METOPROLOL TARTRATE 50 MG PO TABS: 50.0000 mg | ORAL_TABLET | Freq: Once | ORAL | 0 refills | Status: DC

## 2020-06-02 NOTE — Addendum Note (Signed)
Addended by: Merri Ray A on: 06/02/2020 03:44 PM   Modules accepted: Orders

## 2020-06-05 ENCOUNTER — Other Ambulatory Visit: Payer: Self-pay

## 2020-06-05 ENCOUNTER — Ambulatory Visit (HOSPITAL_COMMUNITY)
Admission: RE | Admit: 2020-06-05 | Discharge: 2020-06-05 | Disposition: A | Discharge status: Home / Self Care | Payer: Medicare Other | Source: Ambulatory Visit | Attending: Cardiovascular Disease | Admitting: Cardiovascular Disease

## 2020-06-05 DIAGNOSIS — I350 Nonrheumatic aortic (valve) stenosis: Secondary | ICD-10-CM | POA: Insufficient documentation

## 2020-06-05 MED ORDER — IOHEXOL 350 MG/ML SOLN
100.0000 mL | Freq: Once | INTRAVENOUS | Status: AC | PRN
  Administered 2020-06-05: 100 mL via INTRAVENOUS

## 2020-06-05 NOTE — Progress Notes (Signed)
VASCULAR LAB    Carotid duplex has been performed.  See CV proc for preliminary results.   Magin Balbi, RVT 06/05/2020, 9:32 AM

## 2020-06-19 ENCOUNTER — Other Ambulatory Visit: Payer: Self-pay

## 2020-06-19 ENCOUNTER — Institutional Professional Consult (permissible substitution) (INDEPENDENT_AMBULATORY_CARE_PROVIDER_SITE_OTHER): Payer: Medicare Other | Admitting: Surgery

## 2020-06-19 ENCOUNTER — Encounter: Payer: Self-pay | Admitting: Surgery

## 2020-06-19 VITALS — BP 132/84 | HR 94 | Resp 20 | Ht 73.0 in | Wt 395.0 lb

## 2020-06-19 DIAGNOSIS — I350 Nonrheumatic aortic (valve) stenosis: Secondary | ICD-10-CM | POA: Diagnosis not present

## 2020-06-19 NOTE — Progress Notes (Signed)
Patient ID: Dakota Meyer, male   DOB: November 03, 1955, 65 y.o.   MRN: [id number]  [facility] CONSULTATION REPORT  Referring Provider is Donato Heinz* Primary Cardiologist is No primary care provider on file. PCP is Candi Leash, PA-C  Chief Complaint  Patient presents with  . 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  He has fatigue and shortness of breath with light exertion such as going up a few stairs.  He has severe degenerative arthritis of both knees and needs knee replacements which decreases his mobility and increases exertion with walking.   Had no peripheral edema.  He does have dizziness with standing and has to wait a minute or two for that to clear.  Echocardiogram in September 2021 showed moderate calcification of the aortic valve with a mean gradient of 26 mmHg and a peak gradient 43 mmHg.  Dimensionless index was 0.29.  Left ventricular ejection fraction was 35 to 40% with global hypokinesis and grade 1 diastolic dysfunction.  His most recent echocardiogram on 05/16/2020 showed an increase in the mean gradient to 36 mmHg with a peak gradient of 61.5 mmHg.  Dimensionless index of 0.33.  Left ventricular ejection fraction is 30 to 35% with grade 1 diastolic dysfunction.  He subsequently underwent cardiac catheterization on 05/25/2020 which showed angiographically normal coronary arteries.  Right heart pressures were normal with high cardiac output.  The peak to peak gradient across aortic valve was 52 mmHg with a  mean gradient of 37 mmHg.  Aortic valve area was measured at 1.7 cm.  The patient is married and lives with his wife.  He is a non-smoker.  He still works part-time.  Past Medical History:  Diagnosis Date  . Chronic knee pain   . Hypertension   . Leukemia ([place]) 1997  . Obesity   . Osteoarthritis 1998  . Tinnitus aurium   . Venous stasis     Past Surgical History:  Procedure Laterality Date  . RIGHT/LEFT HEART CATH AND CORONARY ANGIOGRAPHY N/A 05/25/2020   Procedure: RIGHT/LEFT HEART CATH AND CORONARY ANGIOGRAPHY;  Surgeon: Sherren Mocha, MD;  Location: [facility];  Service: Cardiovascular;  Laterality: N/A;    Family History  Problem Relation Age of Onset  . Cancer Mother        thyroid ca  . Aneurysm Mother   . Cancer Father        lung    Social History   Socioeconomic History  . Marital status: Married    Spouse name: Not on file  . Number of children: Not on file  . Years of education: Not on file  . Highest education level: Not on file  Occupational History  . Not on file  Tobacco Use  . Smoking status: Never Smoker  . Smokeless tobacco: Never Used  Substance and Sexual Activity  . Alcohol use: Yes  . Drug use: No  . Sexual activity: Not on file  Other Topics Concern  . Not on file  Social History Narrative  . Not on file  Social Determinants of Health   Financial Resource Strain: Not on file  Food Insecurity: Not on file  Transportation Needs: Not on file  Physical Activity: Not on file  Stress: Not on file  Social Connections: Not on file  Intimate Partner Violence: Not on file    Current Outpatient Medications  Medication Sig Dispense Refill  . amLODipine (NORVASC) 10 MG tablet Take 0.5 tablets (5 mg total) by mouth daily. 30 tablet 3  . aspirin EC 81 MG tablet Take 1 tablet (81 mg total) by mouth daily. Swallow whole. 90 tablet 3  . atorvastatin (LIPITOR) 20 MG tablet Take 20 mg by mouth at bedtime.    Marland Kitchen buPROPion (WELLBUTRIN XL)  150 MG 24 hr tablet Take 150 mg by mouth 2 (two) times daily.    . carvedilol (COREG) 12.5 MG tablet Take 1 tablet (12.5 mg total) by mouth 2 (two) times daily. 180 tablet 1  . Cod Liver Oil OIL Take 1 capsule by mouth daily. 375 mg vitamin A 415 mg cod liver oil    . diazepam (VALIUM) 10 MG tablet Take 10 mg by mouth daily as needed for anxiety.    . DULoxetine (CYMBALTA) 60 MG capsule Take 60 mg by mouth daily.    . empagliflozin (JARDIANCE) 10 MG TABS tablet Take 1 tablet (10 mg total) by mouth daily before breakfast. 21 tablet 0  . eplerenone (INSPRA) 25 MG tablet Take 0.5 tablets (12.5 mg total) by mouth daily. 15 tablet 3  . hydrochlorothiazide (HYDRODIURIL) 25 MG tablet Take 25 mg by mouth every morning.    . Omega-3 Fatty Acids (FISH OIL) 1200 MG CPDR Take 1,200 mg by mouth daily.    Marland Kitchen OVER THE COUNTER MEDICATION Take 1 capsule by mouth daily. Go-Out    . oxyCODONE-acetaminophen (PERCOCET) 10-325 MG tablet Take 2 tablets by mouth 3 (three) times daily as needed for pain.    . sacubitril-valsartan (ENTRESTO) 49-51 MG Take 1 tablet by mouth 2 (two) times daily. 60 tablet 6  . Semaglutide (RYBELSUS) 3 MG TABS Take 3 mg by mouth daily before breakfast.    . tamsulosin (FLOMAX) 0.4 MG CAPS capsule Take 0.4 mg by mouth daily.    . metoprolol tartrate (LOPRESSOR) 50 MG tablet Take 1 tablet (50 mg total) by mouth once for 1 dose. Please take 2 hr prior to CT scans 1 tablet 0   No current facility-administered medications for this visit.    No Known Allergies    Review of Systems:   General:  normal appetite, + decreased energy, no weight gain, + weight loss, no fever  Cardiac:  no chest pain with exertion, no chest pain at rest, +SOB with mild exertion, no resting SOB, no PND, + orthopnea, no palpitations, + arrhythmia, no atrial fibrillation, + LE edema, + dizzy spells, no syncope  Respiratory:  + exertional shortness of breath, no home oxygen, no productive cough, no dry cough, no  bronchitis, + wheezing, no hemoptysis, no asthma, no pain with inspiration or cough, no sleep apnea, no CPAP at night  GI:   no difficulty swallowing, no reflux, no frequent heartburn, no hiatal hernia, no abdominal pain, + constipation, no diarrhea, no hematochezia, no hematemesis, no melena  GU:   no dysuria,  + frequency, no urinary tract infection, no hematuria, + enlarged prostate, no kidney stones, no kidney disease  Vascular:  no pain suggestive of claudication, + pain in feet, + leg cramps, no varicose veins, no DVT, no non-healing  foot ulcer  Neuro:   no stroke, no TIA's, no seizures, + headaches, no temporary blindness one eye,  no slurred speech, no peripheral neuropathy, + chronic pain, + instability of gait, no memory/cognitive dysfunction  Musculoskeletal: + arthritis, + joint swelling, no myalgias, + difficulty walking, + reduced mobility   Skin:   no rash, no itching, no skin infections, no pressure sores or ulcerations  Psych:   + anxiety, + depression, + nervousness, + unusual recent stress  Eyes:   no blurry vision, + floaters, no recent vision changes, + wears glasses or contacts  ENT:   + hearing loss, no loose or painful teeth, no dentures, last saw dentist 01/2019  Hematologic:  no easy bruising, no abnormal bleeding, no clotting disorder, no frequent epistaxis  Endocrine:  no diabetes, does not check CBG's at home     Physical Exam:   BP 132/84 (BP Location: Left Arm, Patient Position: Sitting, Cuff Size: Large) Comment (BP Location): forearm  Pulse 94   Resp 20   Ht 6\' 1"  (1.854 m)   Wt (!) 395 lb (179.2 kg)   SpO2 96% Comment: RA  BMI 52.11 kg/m   General:  Morbidly obese,  well-appearing, walks with cane  HEENT:  Unremarkable, NCAT, PERLA, EOMI  Neck:   no JVD, no bruits, no adenopathy   Chest:   clear to auscultation, symmetrical breath sounds, no wheezes, no rhonchi   CV:   RRR, grade lll/VI crescendo/decrescendo murmur heard best at RSB,  no diastolic  murmur  Abdomen:  soft, non-tender, no masses   Extremities:  warm, well-perfused, pulses palpable in feet, no LE edema  Rectal/GU  Deferred  Neuro:   Grossly non-focal and symmetrical throughout  Skin:   Clean and dry, no rashes, no breakdown   Diagnostic Tests:  ECHOCARDIOGRAM REPORT       Patient Name:  KEONA SHEFFLER Date of Exam: 05/16/2020  Medical Rec #: [id number]    Height:    73.0 in  Accession #:  [id number]   Weight:    419.0 lb  Date of Birth: [birth year]    BSA:     2.946 m  Patient Age:  28 years    BP:      110/80 mmHg  Patient Gender: M        HR:      84 bpm.  Exam Location: [address]   Procedure: 2D Echo, Cardiac Doppler, Color Doppler and Intracardiac       Opacification Agent   Indications:  I50.41 CHF    History:    Patient has prior history of Echocardiogram examinations.  CHF;         Risk Factors:Hypertension, Dyslipidemia and Sleep Apnea.         Leukemia.    Sonographer:  Jessee Avers, RDCS  Referring Phys: [id number] [place]     Sonographer Comments: Technically difficult study due to poor echo windows  and suboptimal apical window. Image acquisition challenging due to patient  body habitus.  IMPRESSIONS    1. Left ventricular ejection fraction, by estimation, is 30 to 35%. The  left ventricle has moderately decreased function. The left ventricle  demonstrates regional wall motion abnormalities (see scoring  diagram/findings for description). Left ventricular  diastolic parameters are consistent with Grade I diastolic dysfunction  (impaired relaxation).  2. Right ventricular systolic function is normal. The right ventricular  size is normal.  3. The mitral valve is normal in structure. No evidence  of mitral valve  regurgitation.  4. The aortic valve is calcified. There is moderate calcification of the  aortic valve. There is  moderate thickening of the aortic valve. Aortic  valve regurgitation is mild. Moderate to severe aortic valve stenosis.   FINDINGS  Left Ventricle: Left ventricular ejection fraction, by estimation, is 30  to 35%. The left ventricle has moderately decreased function. The left  ventricle demonstrates regional wall motion abnormalities. Severe  hypokinesis of the left ventricular,  mid-apical anteroseptal wall, lateral wall and apical segment. Definity  contrast agent was given IV to delineate the left ventricular endocardial  borders. Left ventricular diastolic parameters are consistent with Grade I  diastolic dysfunction (impaired  relaxation).   Right Ventricle: The right ventricular size is normal. Right ventricular  systolic function is normal.   Left Atrium: Left atrial size was normal in size.   Right Atrium: Right atrial size was normal in size.   Mitral Valve: The mitral valve is normal in structure.   Tricuspid Valve: The tricuspid valve is normal in structure. Tricuspid  valve regurgitation is not demonstrated.   Aortic Valve: The aortic valve is calcified. There is moderate  calcification of the aortic valve. There is moderate thickening of the  aortic valve. There is moderate aortic valve annular calcification. Aortic  valve regurgitation is mild. Moderate to  severe aortic stenosis is present. Aortic valve mean gradient measures  36.0 mmHg. Aortic valve peak gradient measures 61.5 mmHg. Aortic valve  area, by VTI measures 1.89 cm.   IAS/Shunts: The atrial septum is grossly normal.   LEFT VENTRICLE  PLAX 2D  LVIDd:     6.70 cm Diastology  LVIDs:     4.40 cm LV e' medial:  3.85 cm/s  LV PW:     1.10 cm LV E/e' medial: 14.2  LV IVS:    1.00 cm LV e' lateral:  11.10 cm/s  LVOT diam:   2.70 cm LV E/e' lateral: 4.9  LV SV:     150  LV SV Index:  51  LVOT Area:   5.73 cm     RIGHT VENTRICLE  RV Basal diam: 3.50 cm  RV S  prime:   8.31 cm/s  TAPSE (M-mode): 2.6 cm   LEFT ATRIUM       Index    RIGHT ATRIUM      Index  LA diam:    4.60 cm 1.56 cm/m RA Pressure: 3.00 mmHg  LA Vol (A2C):  60.3 ml 20.47 ml/m RA Area:   14.20 cm  LA Vol (A4C):  57.3 ml 19.45 ml/m RA Volume:  33.70 ml 11.44 ml/m  LA Biplane Vol: 58.8 ml 19.96 ml/m  AORTIC VALVE  AV Area (Vmax):  1.89 cm  AV Area (Vmean):  1.80 cm  AV Area (VTI):   1.89 cm  AV Vmax:      392.25 cm/s  AV Vmean:     278.750 cm/s  AV VTI:      0.792 m  AV Peak Grad:   61.5 mmHg  AV Mean Grad:   36.0 mmHg  LVOT Vmax:     129.50 cm/s  LVOT Vmean:    87.450 cm/s  LVOT VTI:     0.262 m  LVOT/AV VTI ratio: 0.33    AORTA  Ao Root diam: 3.60 cm   MITRAL VALVE        TRICUSPID VALVE               Estimated RAP:  3.00 mmHg    MV E velocity: 54.80 cm/s SHUNTS  MV A velocity: 90.00 cm/s Systemic VTI: 0.26 m  MV E/A ratio: 0.61    Systemic Diam: 2.70 cm   Mertie Moores MD  Electronically signed by Mertie Moores MD  Signature Date/Time: 05/16/2020/3:03:42 PM     Physicians  Panel Physicians Referring Physician Case Authorizing Physician  Sherren Mocha, MD (Primary)      Procedures  RIGHT/LEFT HEART CATH AND CORONARY ANGIOGRAPHY   Conclusion  1.  Angiographically normal coronary arteries (left dominant) 2.  Essentially normal right heart pressures and hemodynamics with high cardiac output 3.  Calcified, restricted aortic valve with a peak to peak gradient 52 mmHg, mean gradient 37 mmHg, calculated aortic valve area 1.7 cm.  Recommendations: Suspected severe aortic stenosis, transvalvular gradients are in the severe range and valve area is likely erroneous with this patient's high cardiac output.  Recommend multidisciplinary heart team evaluation for consideration of TAVR versus surgical AVR.   Indications  Severe aortic stenosis [I35.0  (ICD-10-CM)]   Procedural Details  Technical Details INDICATION: CHF, aortic stenosis. 400 pound gentleman with CHF, LV dysfunction, and what appears to be severe aortic stenosis (echo imaging difficult). He is referred for R/L heart cath.  PROCEDURAL DETAILS: Using direct ultrasound guidance, an antecubital vein is accessed successfully with a micropuncture needle.  A 7 French sheath is inserted.  Ultrasound images are digitally captured and stored in the patient's chart.  The right wrist was then prepped, draped, and anesthetized with 1% lidocaine. Using ultrasound guidance and the modified Seldinger technique a 5/6 French Slender sheath was placed in the right radial artery. Intra-arterial verapamil was administered through the radial artery sheath. IV heparin was administered after a JR4 catheter was advanced into the central aorta. A Swan-Ganz catheter was used for the right heart catheterization. Standard protocol was followed for recording of right heart pressures and sampling of oxygen saturations. Fick cardiac output was calculated. Standard Judkins catheters were used for selective coronary angiography. LV pressure is recorded and an aortic valve pullback is performed. There were no immediate procedural complications. The patient was transferred to the post catheterization recovery area for further monitoring.    Estimated blood loss <50 mL.   During this procedure medications were administered to achieve and maintain moderate conscious sedation while the patient's heart rate, blood pressure, and oxygen saturation were continuously monitored and I was present face-to-face 100% of this time.   Medications (Filter: Administrations occurring from 1258 to 1411 on 05/25/20) (important) Continuous medications are totaled by the amount administered until 05/25/20 1411.    Heparin (Porcine) in NaCl 1000-0.9 UT/500ML-% SOLN (mL) Total volume:  1,000 mL  Date/Time Rate/Dose/Volume Action    05/25/20 1314 500 mL Given   1314 500 mL Given    midazolam (VERSED) injection (mg) Total dose:  2 mg  Date/Time Rate/Dose/Volume Action   05/25/20 1319 1 mg Given   1335 1 mg Given    fentaNYL (SUBLIMAZE) injection (mcg) Total dose:  50 mcg  Date/Time Rate/Dose/Volume Action   05/25/20 1319 25 mcg Given   1335 25 mcg Given    lidocaine (PF) (XYLOCAINE) 1 % injection (mL) Total volume:  4 mL  Date/Time Rate/Dose/Volume Action   05/25/20 1336 2 mL Given   1338 2 mL Given    Radial Cocktail/Verapamil only (mL) Total volume:  10 mL  Date/Time Rate/Dose/Volume Action   05/25/20 1338 10 mL Given    heparin sodium (porcine) injection (Units) Total  dose:  8,000 Units  Date/Time Rate/Dose/Volume Action   05/25/20 1348 8,000 Units Given    iohexol (OMNIPAQUE) 350 MG/ML injection (mL) Total volume:  50 mL  Date/Time Rate/Dose/Volume Action   05/25/20 1359 50 mL Given    Contrast  Medication Name Total Dose  iohexol (OMNIPAQUE) 350 MG/ML injection 50 mL    Radiation/Fluoro  Fluoro time: 6.3 (min) DAP: 27.8 (Gycm2) Cumulative Air Kerma: 362.6 (mGy)   Coronary Findings   Diagnostic Dominance: Left  Left Main  Vessel is large.  Left Anterior Descending  Vessel is angiographically normal.  Right Coronary Artery  Vessel is small.   Intervention   No interventions have been documented.  Left Heart  Aortic Valve There is severe aortic valve stenosis. The aortic valve is calcified. There is restricted aortic valve motion.   Coronary Diagrams   Diagnostic Dominance: Left    Intervention    Implants    No implant documentation for this case.   Syngo Images  Show images for CARDIAC CATHETERIZATION  Images on Long Term Storage  Show images for [place], [place] to Procedure Log  Procedure Log     Hemo Data  Flowsheet Row Most Recent Value  Fick Cardiac Output 9.69 L/min  Fick Cardiac Output Index 3.34 (L/min)/BSA  Aortic  Mean Gradient 37.43 mmHg  Aortic Peak Gradient 52 mmHg  Aortic Valve Area 1.70  Aortic Value Area Index 0.59 cm2/BSA  RA A Wave 6 mmHg  RA V Wave 6 mmHg  RA Mean 4 mmHg  RV Systolic Pressure 41 mmHg  RV Diastolic Pressure 5 mmHg  RV EDP 9 mmHg  PA Systolic Pressure 31 mmHg  PA Diastolic Pressure 18 mmHg  PA Mean 22 mmHg  PW A Wave 19 mmHg  PW V Wave 17 mmHg  PW Mean 16 mmHg  AO Systolic Pressure [id number] mmHg  AO Diastolic Pressure 62 mmHg  AO Mean 82 mmHg  LV Systolic Pressure [id number] mmHg  LV Diastolic Pressure 6 mmHg  LV EDP 10 mmHg  AOp Systolic Pressure 83 mmHg  AOp Diastolic Pressure 62 mmHg  AOp Mean Pressure 72 mmHg  LVp Systolic Pressure [id number] mmHg  LVp Diastolic Pressure 8 mmHg  LVp EDP Pressure 15 mmHg  QP/QS 1.1  TPVR Index 6.58 HRUI  TSVR Index 24.54 HRUI  PVR SVR Ratio 0.08  TPVR/TSVR Ratio 0.27   ADDENDUM REPORT: 06/07/2020 23:44  CLINICAL DATA:  89 -year-old male with severe aortic stenosis being evaluated for a TAVR procedure.  EXAM: Cardiac TAVR CT  TECHNIQUE: The patient was scanned on a [employer]. A 120 kV retrospective scan was triggered in the descending thoracic aorta at 111 HU's. Gantry rotation speed was 250 msecs and collimation was .6 mm. No beta blockade or nitro were given. The 3D data set was reconstructed in 5% intervals of the R-R cycle. Systolic and diastolic phases were analyzed on a dedicated work station using MPR, MIP and VRT modes. The patient received 80 cc of contrast.  FINDINGS: Aortic Root:  Aortic valve: Trileaflet  Aortic valve calcium score: 3409  Aortic annulus:  Diameter: 5mm x 40mm  Perimeter: 14mm  Area: 664 mm^2  Calcifications: Mild calcification adjacent to [place]  Coronary height: Min Left - 32mm, Max Left - 41mm; Min Right - 52mm  Sinotubular height: Left cusp - 5mm; Right cusp - 41mm; Noncoronary cusp - 58mm  LVOT (as measured 3 mm below the annulus):  Diameter: 26mm x  22mm  Area: 734  mm^2  Calcifications: No calcifications  Aortic sinus width: Left cusp - 90mm; Right cusp - 34mm; Noncoronary cusp - 50mm  Sinotubular junction width: 49mm x 41mm  Optimum Fluoroscopic Angle for Delivery: RAO 30 CAU 8  Cardiac:  Right atrium: Normal size  Right ventricle: Normal size  Pulmonary arteries: Normal size  Pulmonary veins: Normal configuration  Left atrium: Moderate enlargement  Left ventricle: Moderately dilated  Pericardium: Normal thickness  Coronary arteries: Calcium score 195 (64th percentile)  IMPRESSION: 1. Poor image quality, likely due to morbid obesity causing high image noise  2. Trileaflet aortic valve with severe calcifications (AV calcium score 3409)  3. Aortic annulus measures 34mm x 69mm in diameter with perimeter 68mm and area 664 mm^2. Mild annular calcifications adjacent to [place]. Annular measurements suitable for delivery of 85mm Edwards Sapien 3 valve  4. Sufficient coronary to annulus distance, measuring 39mm to left main and 81mm to RCA  5.  Optimum Fluoroscopic Angle for Delivery: RAO 30 CAU 8  6.  Coronary calcium score 195 (64th percentile)   Electronically Signed   By: Oswaldo Milian MD   On: 06/07/2020 23:44   Addended by Donato Heinz, MD on 06/07/2020 11:46 PM    Study Result  Narrative & Impression  EXAM: OVER-READ INTERPRETATION  CT CHEST  The following report is an over-read performed by radiologist Dr. Vinnie Langton of [facility], [place] on 06/05/2020. This over-read does not include interpretation of cardiac or coronary anatomy or pathology. The coronary calcium score/coronary CTA interpretation by the cardiologist is attached.  COMPARISON:  None.  FINDINGS: Extracardiac findings will be described separately under dictation for contemporaneously obtained CTA chest, abdomen and pelvis.  IMPRESSION: Please see separate dictation for  contemporaneously obtained CTA chest, abdomen and pelvis dated 06/05/2020 for full description of relevant extracardiac findings.  Electronically Signed: By: Vinnie Langton M.D. On: 06/05/2020 11:56     Narrative & Impression  CLINICAL DATA:  65 year old male with history of severe aortic stenosis. Preprocedural study prior to potential transcatheter aortic valve replacement (TAVR) procedure.  EXAM: CT ANGIOGRAPHY CHEST, ABDOMEN AND PELVIS  TECHNIQUE: Non-contrast CT of the chest was initially obtained.  Multidetector CT imaging through the chest, abdomen and pelvis was performed using the standard protocol during bolus administration of intravenous contrast. Multiplanar reconstructed images and MIPs were obtained and reviewed to evaluate the vascular anatomy.  CONTRAST:  182mL OMNIPAQUE IOHEXOL 350 MG/ML SOLN  COMPARISON:  No priors.  FINDINGS: CTA CHEST FINDINGS  Cardiovascular: Heart size is borderline enlarged with concentric left ventricular hypertrophy. There is no significant pericardial fluid, thickening or pericardial calcification. There is aortic atherosclerosis, as well as atherosclerosis of the great vessels of the mediastinum and the coronary arteries, including calcified atherosclerotic plaque in the left anterior descending and left circumflex coronary arteries. Severe calcifications of the aortic valve.  Mediastinum/Lymph Nodes: No pathologically enlarged mediastinal or hilar lymph nodes. Please note that accurate exclusion of hilar adenopathy is limited on noncontrast CT scans. Esophagus is unremarkable in appearance. No axillary lymphadenopathy.  Lungs/Pleura: No acute consolidative airspace disease. No pleural effusions. No suspicious appearing pulmonary nodules or masses are noted. Areas of lucency interspersed with areas of ground-glass attenuation with intervening geographic margins, strongly suggestive of air trapping from small  airways disease.  Musculoskeletal/Soft Tissues: There are no aggressive appearing lytic or blastic lesions noted in the visualized portions of the skeleton.  CTA ABDOMEN AND PELVIS FINDINGS  Hepatobiliary: No suspicious cystic or solid hepatic lesions. No intra  or extrahepatic biliary ductal dilatation. Mild diffuse low attenuation throughout the hepatic parenchyma, indicative of hepatic steatosis. Status post cholecystectomy.  Pancreas: No pancreatic mass. No pancreatic ductal dilatation. No pancreatic or peripancreatic fluid collections or inflammatory changes.  Spleen: Calcified granulomas in the spleen. Otherwise, unremarkable.  Adrenals/Urinary Tract: Bilateral kidneys and adrenal glands are normal in appearance. No hydroureteronephrosis. Urinary bladder is normal in appearance.  Stomach/Bowel: The appearance of the stomach is normal. No pathologic dilatation of small bowel or colon. Normal appendix.  Vascular/Lymphatic: Aortic atherosclerosis, without evidence of aneurysm or dissection in the abdominal or pelvic vasculature. Vascular findings and measurements pertinent to potential TAVR procedure, as detailed below. No lymphadenopathy noted in the abdomen or pelvis.  Reproductive: Prostate gland and seminal vesicles are unremarkable in appearance.  Other: No significant volume of ascites.  No pneumoperitoneum.  Musculoskeletal: There are no aggressive appearing lytic or blastic lesions noted in the visualized portions of the skeleton.  VASCULAR MEASUREMENTS PERTINENT TO TAVR:  Comment: Today's study is of very poor technical quality secondary to suboptimal contrast bolus and the patient's large body habitus which creates excessive image noise. As a result, vascular findings and measurements below are best estimates given that image quality was considered nearly nondiagnostic.  AORTA:  Minimal Aortic Diameter-22 x 18 mm  Severity of Aortic  Calcification-mild  RIGHT PELVIS:  Right Common Iliac Artery -  Minimal Diameter-12.7 x 13.4 mm  Tortuosity-mild  Calcification-mild  Right External Iliac Artery -  Minimal Diameter-8.0 x 6.8 mm  Tortuosity-severe  Calcification-none  Right Common Femoral Artery -  Minimal Diameter-8.1 x 6.3 mm  Tortuosity-mild  Calcification-mild  LEFT PELVIS:  Left Common Iliac Artery -  Minimal Diameter-14.1 x 13.3 mm  Tortuosity-mild  Calcification-none  Left External Iliac Artery -  Minimal Diameter-9.5 x 8.0 mm  Tortuosity-severe  Calcification-none  Left Common Femoral Artery -  Minimal Diameter-9.4 x 8.2 mm  Tortuosity-mild  Calcification-none  Review of the MIP images confirms the above findings.  IMPRESSION: 1. Vascular findings and measurements pertinent to potential TAVR procedure, as detailed above. 2. Severe thickening and calcification of the aortic valve, compatible with reported clinical history of severe aortic stenosis. 3. Concentric left ventricular hypertrophy. 4. Evidence of air trapping in the lungs, suggestive of small airways disease. 5. Hepatic steatosis. 6. Additional incidental findings, as above.   Electronically Signed   By: Vinnie Langton M.D.   On: 06/06/2020 05:52     Procedure: Isolated AVR  Risk of Mortality:  2.256%  Renal Failure:  3.722%  Permanent Stroke:  0.502%  Prolonged Ventilation:  11.627%  DSW Infection:  0.267%  Reoperation:  3.344%  Morbidity or Mortality:  16.105%  Short Length of Stay:  30.133%  Long Length of Stay:  7.026%   Impression:  The patient is a 65 year old gentleman who presents with stage D, severe, symptomatic aortic stenosis with New York Heart Association class III symptoms of exertional fatigue and shortness of breath as well as dizziness, lower extremity edema and orthopnea.  I have personally reviewed his 2D echocardiogram, cardiac  catheterization, and CTA studies.  2D echocardiogram shows an ejection fraction of 30 to 35% which is slightly decreased from September 2021.  Cardiac catheterization shows normal coronary arteries.  It appears that his chronic combined systolic and diastolic congestive heart failure are related to valvular heart disease.  He may also have obesity hypoventilation syndrome with a PCO2 of 52 on his room air arterial blood gas.  I think the best treatment  for him is aortic valve replacement for relief of his symptoms and to prevent progressive left ventricular deterioration.  Given his morbid obesity and comorbid risk factors I think the best option would be to perform transcatheter aortic valve replacement.  His gated cardiac CTA shows anatomy suitable for TAVR using a SAPIEN 3 valve.  His abdominal and pelvic CTA appears to show adequate pelvic vascular anatomy to allow transfemoral insertion.  The patient was counseled at length regarding treatment alternatives for management of severe symptomatic aortic stenosis. The risks and benefits of surgical intervention has been discussed in detail.  I think he would have a very slow and difficult recovery following open aortic valve replacement.  Long-term prognosis with medical therapy was discussed. Alternative approaches such as conventional surgical aortic valve replacement, transcatheter aortic valve replacement, and palliative medical therapy were compared and contrasted at length. This discussion was placed in the context of the patient's own specific clinical presentation and past medical history. All of his questions have been addressed.   Following the decision to proceed with transcatheter aortic valve replacement, a discussion was held regarding what types of management strategies would be attempted intraoperatively in the event of life-threatening complications, including whether or not the patient would be considered a candidate for the use of  cardiopulmonary bypass and/or conversion to open sternotomy for attempted surgical intervention.  I think he would be a candidate for emergent sternotomy if needed to manage any intraoperative complications although his operative risk would certainly be significantly increased due to his morbid obesity.  The patient is aware of the fact that transient use of cardiopulmonary bypass may be necessary. The patient has been advised of a variety of complications that might develop including but not limited to risks of death, stroke, paravalvular leak, aortic dissection or other major vascular complications, aortic annulus rupture, device embolization, cardiac rupture or perforation, mitral regurgitation, acute myocardial infarction, arrhythmia, heart block or bradycardia requiring permanent pacemaker placement, congestive heart failure, respiratory failure, renal failure, pneumonia, infection, other late complications related to structural valve deterioration or migration, or other complications that might ultimately cause a temporary or permanent loss of functional independence or other long term morbidity. The patient provides full informed consent for the procedure as described and all questions were answered.    Plan:  He will be scheduled for transfemoral transcatheter aortic valve replacement using a SAPIEN 3 valve on Tuesday, 06/27/2020.  I spent 50 minutes performing this consultation and > 50% of this time was spent face to face counseling and coordinating the care of this patient's severe symptomatic aortic stenosis.      Gaye Pollack, MD 06/19/2020 4:23 PM

## 2020-06-20 ENCOUNTER — Other Ambulatory Visit: Payer: Self-pay

## 2020-06-20 DIAGNOSIS — I350 Nonrheumatic aortic (valve) stenosis: Secondary | ICD-10-CM

## 2020-06-22 NOTE — Progress Notes (Addendum)
Surgical Instructions    Your procedure is scheduled on 06/27/20.  Report to [facility] Main Entrance "A" at 08:00 A.M., then check in with the Admitting office.  Call this number if you have problems the morning of surgery:  [phone]   If you have any questions prior to your surgery date call [phone]: Open Monday-Friday 8am-4pm    Remember:  Do not eat or drink after midnight the night before your surgery    Continue taking all medications without change through the day before surgery.              On the morning of surgery do not take any medications.   WHAT DO I DO ABOUT MY DIABETES MEDICATION?   Marland Kitchen Do not take oral diabetes medicines (pills) the morning of surgery.   . The day of surgery, do not take other diabetes injectables, including Byetta (exenatide), Bydureon (exenatide ER), Victoza (liraglutide), or Trulicity (dulaglutide).  . If your CBG is greater than 220 mg/dL, you may take  of your sliding scale (correction) dose of insulin.   HOW TO MANAGE YOUR DIABETES BEFORE AND AFTER SURGERY  Why is it important to control my blood sugar before and after surgery? . Improving blood sugar levels before and after surgery helps healing and can limit problems. . A way of improving blood sugar control is eating a healthy diet by: o  Eating less sugar and carbohydrates o  Increasing activity/exercise o  Talking with your doctor about reaching your blood sugar goals . High blood sugars (greater than 180 mg/dL) can raise your risk of infections and slow your recovery, so you will need to focus on controlling your diabetes during the weeks before surgery. . Make sure that the doctor who takes care of your diabetes knows about your planned surgery including the date and location.  How do I manage my blood sugar before surgery? . Check your blood sugar at least 4 times a day, starting 2 days before surgery, to make sure that the level is not too high or low. . Check your  blood sugar the morning of your surgery when you wake up and every 2 hours until you get to the Short Stay unit. o If your blood sugar is less than 70 mg/dL, you will need to treat for low blood sugar: - Do not take insulin. - Treat a low blood sugar (less than 70 mg/dL) with  cup of clear juice (cranberry or apple), 4 glucose tablets, OR glucose gel. - Recheck blood sugar in 15 minutes after treatment (to make sure it is greater than 70 mg/dL). If your blood sugar is not greater than 70 mg/dL on recheck, call [phone] for further instructions. . Report your blood sugar to the short stay nurse when you get to Short Stay.  . If you are admitted to the hospital after surgery: o Your blood sugar will be checked by the staff and you will probably be given insulin after surgery (instead of oral diabetes medicines) to make sure you have good blood sugar levels. o The goal for blood sugar control after surgery is 80-180 mg/dL.                       Do not wear jewelry, make up, or nail polish            Do not wear lotions, powders, perfumes/colognes, or deodorant.            Men may  shave face and neck.            Do not bring valuables to the hospital.            [facility] is not responsible for any belongings or valuables.  Do NOT Smoke (Tobacco/Vaping) or drink Alcohol 24 hours prior to your procedure If you use a CPAP at night, you may bring all equipment for your overnight stay.   Contacts, glasses, dentures or bridgework may not be worn into surgery, please bring cases for these belongings   For patients admitted to the hospital, discharge time will be determined by your treatment team.   Patients discharged the day of surgery will not be allowed to drive home, and someone needs to stay with them for 24 hours.    Special instructions:   [place]- Preparing For Surgery  Before surgery, you can play an important role. Because skin is not sterile, your skin needs to be as free  of germs as possible. You can reduce the number of germs on your skin by washing with CHG (chlorahexidine gluconate) Soap before surgery.  CHG is an antiseptic cleaner which kills germs and bonds with the skin to continue killing germs even after washing.    Oral Hygiene is also important to reduce your risk of infection.  Remember - BRUSH YOUR TEETH THE MORNING OF SURGERY WITH YOUR REGULAR TOOTHPASTE  Please do not use if you have an allergy to CHG or antibacterial soaps. If your skin becomes reddened/irritated stop using the CHG.  Do not shave (including legs and underarms) for at least 48 hours prior to first CHG shower. It is OK to shave your face.  Please follow these instructions carefully.   1. Shower the NIGHT BEFORE SURGERY and the MORNING OF SURGERY  2. If you chose to wash your hair, wash your hair first as usual with your normal shampoo.  3. After you shampoo, rinse your hair and body thoroughly to remove the shampoo.  4. Wash Face and genitals (private parts) with your normal soap.   5.  Shower the NIGHT BEFORE SURGERY and the MORNING OF SURGERY with CHG Soap.   6. Use CHG Soap as you would any other liquid soap. You can apply CHG directly to the skin and wash gently with a scrungie or a clean washcloth.   7. Apply the CHG Soap to your body ONLY FROM THE NECK DOWN.  Do not use on open wounds or open sores. Avoid contact with your eyes, ears, mouth and genitals (private parts). Wash Face and genitals (private parts)  with your normal soap.   8. Wash thoroughly, paying special attention to the area where your surgery will be performed.  9. Thoroughly rinse your body with warm water from the neck down.  10. DO NOT shower/wash with your normal soap after using and rinsing off the CHG Soap.  11. Pat yourself dry with a CLEAN TOWEL.  12. Wear CLEAN PAJAMAS to bed the night before surgery  13. Place CLEAN SHEETS on your bed the night before your surgery  14. DO NOT SLEEP WITH  PETS.   Day of Surgery: Take a shower with CHG soap. Wear Clean/Comfortable clothing the morning of surgery Do not apply any deodorants/lotions.   Remember to brush your teeth WITH YOUR REGULAR TOOTHPASTE.   Please read over the following fact sheets that you were given.

## 2020-06-23 ENCOUNTER — Other Ambulatory Visit: Payer: Self-pay

## 2020-06-23 ENCOUNTER — Ambulatory Visit (HOSPITAL_COMMUNITY)
Admission: RE | Admit: 2020-06-23 | Discharge: 2020-06-23 | Disposition: A | Discharge status: Home / Self Care | Payer: Medicare Other | Source: Ambulatory Visit | Attending: Cardiovascular Disease | Admitting: Cardiovascular Disease

## 2020-06-23 ENCOUNTER — Encounter: Payer: Self-pay | Admitting: Physical Therapy

## 2020-06-23 ENCOUNTER — Ambulatory Visit: Payer: Medicare Other | Attending: Cardiovascular Disease | Admitting: Physical Therapy

## 2020-06-23 ENCOUNTER — Encounter (HOSPITAL_COMMUNITY)
Admission: RE | Admit: 2020-06-23 | Discharge: 2020-06-23 | Disposition: A | Discharge status: Home / Self Care | Payer: Medicare Other | Source: Ambulatory Visit | Attending: Cardiovascular Disease | Admitting: Cardiovascular Disease

## 2020-06-23 ENCOUNTER — Encounter (HOSPITAL_COMMUNITY): Payer: Self-pay

## 2020-06-23 DIAGNOSIS — Z01818 Encounter for other preprocedural examination: Secondary | ICD-10-CM | POA: Insufficient documentation

## 2020-06-23 DIAGNOSIS — R2689 Other abnormalities of gait and mobility: Secondary | ICD-10-CM | POA: Diagnosis not present

## 2020-06-23 DIAGNOSIS — Z20822 Contact with and (suspected) exposure to covid-19: Secondary | ICD-10-CM | POA: Insufficient documentation

## 2020-06-23 DIAGNOSIS — I350 Nonrheumatic aortic (valve) stenosis: Secondary | ICD-10-CM | POA: Insufficient documentation

## 2020-06-23 HISTORY — DX: Anxiety disorder, unspecified: F41.9

## 2020-06-23 HISTORY — DX: Sleep apnea, unspecified: G47.30

## 2020-06-23 HISTORY — DX: Depression, unspecified: F32.A

## 2020-06-23 HISTORY — DX: Cardiac murmur, unspecified: R01.1

## 2020-06-23 LAB — SURGICAL PCR SCREEN
MRSA, PCR: NEGATIVE
Staphylococcus aureus: POSITIVE — AB

## 2020-06-23 LAB — CBC
HCT: 39.1 % (ref 39.0–52.0)
Hemoglobin: 12.8 g/dL — ABNORMAL LOW (ref 13.0–17.0)
MCH: 31.2 pg (ref 26.0–34.0)
MCHC: 32.7 g/dL (ref 30.0–36.0)
MCV: 95.4 fL (ref 80.0–100.0)
Platelets: 227 10*3/uL (ref 150–400)
RBC: 4.1 MIL/uL — ABNORMAL LOW (ref 4.22–5.81)
RDW: 13 % (ref 11.5–15.5)
WBC: 7.1 10*3/uL (ref 4.0–10.5)
nRBC: 0 % (ref 0.0–0.2)

## 2020-06-23 LAB — TYPE AND SCREEN
ABO/RH(D): O POS
Antibody Screen: NEGATIVE

## 2020-06-23 LAB — URINALYSIS, ROUTINE W REFLEX MICROSCOPIC
Bilirubin Urine: NEGATIVE
Glucose, UA: NEGATIVE mg/dL
Hgb urine dipstick: NEGATIVE
Ketones, ur: NEGATIVE mg/dL
Leukocytes,Ua: NEGATIVE
Nitrite: NEGATIVE
Protein, ur: NEGATIVE mg/dL
Specific Gravity, Urine: 1.025 (ref 1.005–1.030)
pH: 5 (ref 5.0–8.0)

## 2020-06-23 LAB — BLOOD GAS, ARTERIAL
Acid-Base Excess: 3.2 mmol/L — ABNORMAL HIGH (ref 0.0–2.0)
Bicarbonate: 27.7 mmol/L (ref 20.0–28.0)
Drawn by: 602861
FIO2: 21
O2 Saturation: 97.1 %
Patient temperature: 37
pCO2 arterial: 45.9 mmHg (ref 32.0–48.0)
pH, Arterial: 7.398 (ref 7.350–7.450)
pO2, Arterial: 91 mmHg (ref 83.0–108.0)

## 2020-06-23 LAB — COMPREHENSIVE METABOLIC PANEL
ALT: 11 U/L (ref 0–44)
AST: 16 U/L (ref 15–41)
Albumin: 3.3 g/dL — ABNORMAL LOW (ref 3.5–5.0)
Alkaline Phosphatase: 71 U/L (ref 38–126)
Anion gap: 9 (ref 5–15)
BUN: 25 mg/dL — ABNORMAL HIGH (ref 8–23)
CO2: 24 mmol/L (ref 22–32)
Calcium: 9 mg/dL (ref 8.9–10.3)
Chloride: 105 mmol/L (ref 98–111)
Creatinine, Ser: 1.29 mg/dL — ABNORMAL HIGH (ref 0.61–1.24)
GFR, Estimated: >60 mL/min (ref >60)
Glucose, Bld: 110 mg/dL — ABNORMAL HIGH (ref 70–99)
Potassium: 3.9 mmol/L (ref 3.5–5.1)
Sodium: 138 mmol/L (ref 135–145)
Total Bilirubin: 0.5 mg/dL (ref 0.3–1.2)
Total Protein: 6.8 g/dL (ref 6.5–8.1)

## 2020-06-23 LAB — SARS CORONAVIRUS 2 (TAT 6-24 HRS): SARS Coronavirus 2: NEGATIVE

## 2020-06-23 LAB — PROTIME-INR
INR: 1 (ref 0.8–1.2)
Prothrombin Time: 13.2 seconds (ref 11.4–15.2)

## 2020-06-23 LAB — HEMOGLOBIN A1C
Hgb A1c MFr Bld: 5 % (ref 4.8–5.6)
Mean Plasma Glucose: 96.8 mg/dL

## 2020-06-23 NOTE — Therapy (Signed)
[place], [place], [address] Phone: [phone]   Fax:  [phone]  Physical Therapy Pre-TAVR Evaluation  Patient Details  Name: Dakota Meyer MRN: [id number] Date of Birth: 1955-08-29 Referring Provider (PT): Sherren Mocha, MD   Encounter Date: 06/23/2020   PT End of Session - 06/23/20 0812    Visit Number 1    Number of Visits 1    Date for PT Re-Evaluation 06/23/20    Authorization Type [employer] MCR    PT Start Time 0820    PT Stop Time 0850    PT Time Calculation (min) 30 min    Activity Tolerance Patient tolerated treatment well    Behavior During Therapy [facility] for tasks assessed/performed           Past Medical History:  Diagnosis Date  . Chronic knee pain   . Hypertension   . Leukemia ([place]) 1997  . Obesity   . Osteoarthritis 1998  . Tinnitus aurium   . Venous stasis     Past Surgical History:  Procedure Laterality Date  . RIGHT/LEFT HEART CATH AND CORONARY ANGIOGRAPHY N/A 05/25/2020   Procedure: RIGHT/LEFT HEART CATH AND CORONARY ANGIOGRAPHY;  Surgeon: Sherren Mocha, MD;  Location: [facility];  Service: Cardiovascular;  Laterality: N/A;    There were no vitals filed for this visit.    Subjective Assessment - 06/23/20 0829    Subjective Patient reports shortness of breath with just a few step over the past year, then has noticed progressively getting worse past 6 months or so. Patient also notes bad knees, bad shoulder, and starting of a bad hip.    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 < 5 minutes    How long can you walk comfortably? 6-7 minutes    Patient Stated Goals Get heart better    Currently in Pain? Yes    Pain  Score 6     Pain Location Knee    Pain Orientation Right;Left   R > L   Pain Descriptors / Indicators Aching;Throbbing    Pain Type Chronic pain    Pain Onset More than a month ago    Pain Frequency Constant    Aggravating Factors  Walking, standing    Multiple Pain Sites Yes    Pain Score 3    Pain Location Shoulder    Pain Orientation Right    Pain Descriptors / Indicators Sharp    Pain Type Chronic pain    Pain Onset More than a month ago    Pain Frequency Intermittent    Aggravating Factors  Reaching with right arm    Pain Score 2    Pain Location Hip    Pain Orientation Right    Pain Descriptors / Indicators Aching;Burning    Pain Type Chronic pain    Pain Onset More than a month ago    Pain Frequency Intermittent    Aggravating Factors  Walking, standing              [facility] PT Assessment - 06/23/20 0001      Assessment   Medical Diagnosis Severe Aortic Stenosis    Referring Provider (PT) Sherren Mocha, MD    Onset Date/Surgical Date --   4-6 months  Hand Dominance Right    Next MD Visit 06/23/2020    Prior Therapy Yes      Precautions   Precautions Fall      Restrictions   Weight Bearing Restrictions No      Balance Screen   Has the patient fallen in the past 6 months Yes    How many times? Patient reports numerous falls    Has the patient had a decrease in activity level because of a fear of falling?  Yes    Is the patient reluctant to leave their home because of a fear of falling?  No      Home Environment   Living Environment Private residence    Living Arrangements Spouse/significant other    Type of [place] to enter    Entrance Stairs-Number of Steps 2    Entrance Stairs-Rails None    Home Layout One level    [place] Other (comment)   Walking stick     Prior Function   Level of Independence Independent    Leisure None reported      Cognition   Overall Cognitive Status Within Functional Limits for tasks  assessed      Observation/Other Assessments   Observations Patient appears    Focus on Therapeutic Outcomes (FOTO)  NA      Posture/Postural Control   Posture Comments Rounded shoulder posture      ROM / Strength   AROM / PROM / Strength AROM;Strength      AROM   Overall AROM Comments RUE and BLE grossly limited minimally secondary to orthopedic conditions, LUE grossly WFL      Strength   Overall Strength Comments BLE and LUE strength grossly 4/5 MMT, RUE grossly 4-/5 MMT    Strength Assessment Site Hand    Right/Left hand Right;Left    Right Hand Grip (lbs) 105, 115, 115    Left Hand Grip (lbs) 95, 100, 90      Special Tests   Other special tests None performed      Transfers   Transfers Independent with all Transfers   use of BUE support for transfers     Ambulation/Gait   Ambulation/Gait Yes    Ambulation/Gait Assistance 6: Modified independent (Device/Increase time)    Assistive device Other (Comment)   Walking stick on right side   Gait Comments Decreased gait speed, wide BOS, reliance on walking stick for support      6 minute walk test results    Endurance additional comments required rest break at 4:00 until conclusion of test            [facility] Pre-Surgical Assessment - 06/23/20 0001    5 Meter Walk Test- trial 1 8 sec    5 Meter Walk Test- trial 2 8 sec.     5 Meter Walk Test- trial 3 8 sec.    5 meter walk test average 8 sec    4 Stage Balance Test tolerated for:  8 sec.    4 Stage Balance Test Position 2    Sit To Stand Test- trial 1 0 sec.    Comment unable without use of BUE for support    ADL/IADL Independent with: Bathing;Dressing;Meal prep;Finances    ADL/IADL Needs Assistance with: Valla Leaver work    ADL/IADL Fraility Index Midly frail    Other comment 4/12 faility index    6 Minute Walk- Baseline yes    BP (mmHg) 158/99  assessed on left forearm   HR (bpm) 81    02 Sat (%RA) 95 %    Modified Borg Scale for Dyspnea 0- Nothing at all    Perceived  Rate of Exertion (Borg) 6-    6 Minute Walk Post Test yes    BP (mmHg) 181/101   assessed on left forearm   HR (bpm) 113    02 Sat (%RA) 95 %    Modified Borg Scale for Dyspnea 5- Strong or hard breathing    Perceived Rate of Exertion (Borg) 14-    Aerobic Endurance Distance Walked 395                    Objective measurements completed on examination: See above findings.               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, and aortic stenosis    Examination-Activity Limitations Locomotion Level;Reach Overhead;Stand;Stairs;Squat;Transfers    Examination-Participation Restrictions Community Activity;Yard Work;Shop    Stability/Clinical Decision Making Stable/Uncomplicated    Clinical Decision Making Low    Rehab Potential Good    PT Frequency One time visit    PT Treatment/Interventions ADLs/Self Care Home Management;Therapeutic activities;Therapeutic exercise;Balance training;Energy conservation;Gait training;Patient/family education    PT Next Visit Plan NA    PT Home Exercise Plan NA    Consulted and Agree with Plan of Care Patient           Clinical Impression Statement: Pt is a 65 yo male presenting to OP PT for evaluation prior to possible TAVR surgery due to severe aortic stenosis. Pt reports onset of shortness of breath approximately 12 months ago, progressively worsening over past 6 months.  Symptoms are limiting household and community ambulation and performing household tasks. Pt presents with good ROM and strength, decreased balance and is at high fall risk 4 stage balance test, decreased walking speed and poor aerobic endurance per 6 minute walk test. Pt ambulated 395 feet in 4:00 before requesting a seated rest beak lasting 2:00. At time of rest, patient's HR was 118 bpm and O2 was 95% on room air. Pt reported 5/10 shortness of breath on modified scale for dyspnea. Pt ambulated a total of 395 feet in 6 minute walk. Shortness of breath and RPE increased significantly with 6 minute walk test. Based on the Short Physical Performance Battery, patient has a frailty rating of 4/12 with </= 5/12 considered frail.   Patient demonstrates the following deficits and impairments:  Abnormal gait,Difficulty walking,Cardiopulmonary status limiting activity,Decreased activity tolerance,Pain,Decreased strength,Decreased balance,Postural dysfunction,Decreased endurance,Decreased range of motion  Visit Diagnosis: Other abnormalities of gait and mobility     Problem List Patient Active Problem List   Diagnosis Date Noted  . Severe aortic stenosis 05/25/2020  . Hypertension 04/10/2020  . HFrEF (heart failure with reduced ejection fraction) ([place]) 04/10/2020  . Leukemia ([place]) 05/25/2010    Hilda Blades, PT, DPT, LAT, ATC  06/23/20  9:19 AM Phone: [phone] Fax: [facility] [address] [place], [place], [address] Phone: [phone]   Fax:  [phone]  Name: ROXY FILLER MRN: [id number] Date of Birth: October 24, 1955

## 2020-06-23 NOTE — Progress Notes (Addendum)
PCP: Candi Leash, PA-C Cardiologist: Dr. Oswaldo Milian  EKG: 06/23/20 CXR: 06/23/20 ECHO: 05/16/20 Stress Test: denies Cardiac Cath: 05/25/20  Fasting Blood Sugar- not diabetic.  States diabetes meds are for his heart Checks Blood Sugar_0__ times a day  OSA: Yes CPAP: No does not wear  ASA/Blood Thinners: No  Covid test 06/23/20  Anesthesia Review: yes, cardiac history, abnormal EKG  Patient denies shortness of breath, fever, cough, and chest pain at PAT appointment.  Patient verbalized understanding of instructions provided today at the PAT appointment.  Patient asked to review instructions at home and day of surgery.

## 2020-06-26 MED ORDER — POTASSIUM CHLORIDE 2 MEQ/ML IV SOLN
80.0000 meq | INTRAVENOUS | Status: DC
  Filled 2020-06-26: qty 40

## 2020-06-26 MED ORDER — NOREPINEPHRINE 4 MG/250ML-% IV SOLN
0.0000 ug/min | INTRAVENOUS | Status: AC
Start: 2020-06-27 — End: 2020-06-27
  Administered 2020-06-27: 2 ug/min via INTRAVENOUS
  Filled 2020-06-26: qty 250

## 2020-06-26 MED ORDER — SODIUM CHLORIDE 0.9 % IV SOLN
INTRAVENOUS | Status: DC
  Filled 2020-06-26: qty 30

## 2020-06-26 MED ORDER — DEXMEDETOMIDINE HCL IN NACL 400 MCG/100ML IV SOLN
0.1000 ug/kg/h | INTRAVENOUS | Status: AC
  Administered 2020-06-27: 182.72 ug via INTRAVENOUS
  Administered 2020-06-27: 1 ug/kg/h via INTRAVENOUS
  Administered 2020-06-27: .8 ug/kg/h via INTRAVENOUS
  Filled 2020-06-26: qty 100

## 2020-06-26 MED ORDER — VANCOMYCIN HCL 1500 MG/300ML IV SOLN
1500.0000 mg | INTRAVENOUS | Status: AC
  Administered 2020-06-27: 1500 mg via INTRAVENOUS
  Filled 2020-06-26: qty 300

## 2020-06-26 MED ORDER — SODIUM CHLORIDE 0.9 % IV SOLN
1.5000 g | INTRAVENOUS | Status: AC
  Administered 2020-06-27: 1.5 g via INTRAVENOUS
  Filled 2020-06-26: qty 1.5

## 2020-06-26 MED ORDER — MAGNESIUM SULFATE 50 % IJ SOLN
40.0000 meq | INTRAMUSCULAR | Status: DC
  Filled 2020-06-26: qty 9.85

## 2020-06-26 NOTE — H&P (Signed)
[address]       [place] [address]             [phone]      Cardiothoracic Surgery Consultation   Referring Provider is Little Ishikawa* Primary Cardiologist is No primary care provider on file. PCP is Elder Negus, PA-C      Chief Complaint  Patient presents with  . 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  He has fatigue and shortness of breath with light exertion such as going up a few stairs.  He has severe degenerative arthritis of both knees and needs knee replacements which decreases his mobility and increases exertion with walking.   Had no peripheral edema.  He does have dizziness with standing and has to wait a minute or two for that to clear.  Echocardiogram in September 2021 showed moderate calcification of the aortic valve with a mean gradient of 26 mmHg and a peak gradient 43 mmHg.  Dimensionless index was 0.29.  Left ventricular ejection fraction was 35 to 40% with global hypokinesis and grade 1 diastolic dysfunction.  His most recent echocardiogram on 05/16/2020 showed an increase in the mean gradient to 36 mmHg with a peak gradient of 61.5 mmHg.  Dimensionless index of 0.33.  Left ventricular ejection fraction is 30 to 35% with grade 1 diastolic dysfunction.  He subsequently underwent cardiac catheterization on 05/25/2020 which showed angiographically normal coronary arteries.  Right heart pressures were normal with high cardiac output.  The peak to peak gradient across aortic valve was 52 mmHg with a mean gradient of 37 mmHg.  Aortic valve area was measured at 1.7 cm.  The patient is married  and lives with his wife.  He is a non-smoker.  He still works part-time.      Past Medical History:  Diagnosis Date  . Chronic knee pain   . Hypertension   . Leukemia (HCC) 1997  . Obesity   . Osteoarthritis 1998  . Tinnitus aurium   . Venous stasis          Past Surgical History:  Procedure Laterality Date  . RIGHT/LEFT HEART CATH AND CORONARY ANGIOGRAPHY N/A 05/25/2020   Procedure: RIGHT/LEFT HEART CATH AND CORONARY ANGIOGRAPHY;  Surgeon: Tonny Bollman, MD;  Location: [facility] INVASIVE CV LAB;  Service: Cardiovascular;  Laterality: N/A;         Family History  Problem Relation Age of Onset  . Cancer Mother        thyroid ca  . Aneurysm Mother   . Cancer Father        lung    Social History        Socioeconomic History  . Marital status: Married    Spouse name: Not on file  . Number of children: Not on file  . Years of education: Not on file  . Highest education level: Not on file  Occupational History  . Not on file  Tobacco Use  . Smoking status: Never Smoker  . Smokeless tobacco: Never Used  Substance and Sexual Activity  . Alcohol use: Yes  . Drug use: No  . Sexual  activity: Not on file  Other Topics Concern  . Not on file  Social History Narrative  . Not on file   Social Determinants of Health   Financial Resource Strain: Not on file  Food Insecurity: Not on file  Transportation Needs: Not on file  Physical Activity: Not on file  Stress: Not on file  Social Connections: Not on file  Intimate Partner Violence: Not on file          Current Outpatient Medications  Medication Sig Dispense Refill  . amLODipine (NORVASC) 10 MG tablet Take 0.5 tablets (5 mg total) by mouth daily. 30 tablet 3  . aspirin EC 81 MG tablet Take 1 tablet (81 mg total) by mouth daily. Swallow whole. 90 tablet 3  . atorvastatin (LIPITOR) 20 MG tablet Take 20 mg by mouth at bedtime.    Marland Kitchen buPROPion (WELLBUTRIN XL) 150 MG 24 hr tablet Take 150 mg by  mouth 2 (two) times daily.    . carvedilol (COREG) 12.5 MG tablet Take 1 tablet (12.5 mg total) by mouth 2 (two) times daily. 180 tablet 1  . Cod Liver Oil OIL Take 1 capsule by mouth daily. 375 mg vitamin A 415 mg cod liver oil    . diazepam (VALIUM) 10 MG tablet Take 10 mg by mouth daily as needed for anxiety.    . DULoxetine (CYMBALTA) 60 MG capsule Take 60 mg by mouth daily.    . empagliflozin (JARDIANCE) 10 MG TABS tablet Take 1 tablet (10 mg total) by mouth daily before breakfast. 21 tablet 0  . eplerenone (INSPRA) 25 MG tablet Take 0.5 tablets (12.5 mg total) by mouth daily. 15 tablet 3  . hydrochlorothiazide (HYDRODIURIL) 25 MG tablet Take 25 mg by mouth every morning.    . Omega-3 Fatty Acids (FISH OIL) 1200 MG CPDR Take 1,200 mg by mouth daily.    Marland Kitchen OVER THE COUNTER MEDICATION Take 1 capsule by mouth daily. Go-Out    . oxyCODONE-acetaminophen (PERCOCET) 10-325 MG tablet Take 2 tablets by mouth 3 (three) times daily as needed for pain.    . sacubitril-valsartan (ENTRESTO) 49-51 MG Take 1 tablet by mouth 2 (two) times daily. 60 tablet 6  . Semaglutide (RYBELSUS) 3 MG TABS Take 3 mg by mouth daily before breakfast.    . tamsulosin (FLOMAX) 0.4 MG CAPS capsule Take 0.4 mg by mouth daily.    . metoprolol tartrate (LOPRESSOR) 50 MG tablet Take 1 tablet (50 mg total) by mouth once for 1 dose. Please take 2 hr prior to CT scans 1 tablet 0   No current facility-administered medications for this visit.    No Known Allergies    Review of Systems:              General:                      normal appetite, + decreased energy, no weight gain, + weight loss, no fever             Cardiac:                       no chest pain with exertion, no chest pain at rest, +SOB with mild exertion, no resting SOB, no PND, + orthopnea, no palpitations, + arrhythmia, no atrial fibrillation, + LE edema, + dizzy spells, no syncope             Respiratory:                 +  exertional  shortness of breath, no home oxygen, no productive cough, no dry cough, no bronchitis, + wheezing, no hemoptysis, no asthma, no pain with inspiration or cough, no sleep apnea, no CPAP at night             GI:                               no difficulty swallowing, no reflux, no frequent heartburn, no hiatal hernia, no abdominal pain, + constipation, no diarrhea, no hematochezia, no hematemesis, no melena             GU:                              no dysuria,  + frequency, no urinary tract infection, no hematuria, + enlarged prostate, no kidney stones, no kidney disease             Vascular:                     no pain suggestive of claudication, + pain in feet, + leg cramps, no varicose veins, no DVT, no non-healing foot ulcer             Neuro:                         no stroke, no TIA's, no seizures, + headaches, no temporary blindness one eye,  no slurred speech, no peripheral neuropathy, + chronic pain, + instability of gait, no memory/cognitive dysfunction             Musculoskeletal:         + arthritis, + joint swelling, no myalgias, + difficulty walking, + reduced mobility              Skin:                            no rash, no itching, no skin infections, no pressure sores or ulcerations             Psych:                         + anxiety, + depression, + nervousness, + unusual recent stress             Eyes:                           no blurry vision, + floaters, no recent vision changes, + wears glasses or contacts             ENT:                            + hearing loss, no loose or painful teeth, no dentures, last saw dentist 01/2019             Hematologic:               no easy bruising, no abnormal bleeding, no clotting disorder, no frequent epistaxis             Endocrine:                   no diabetes, does not  check CBG's at home                Physical Exam:              BP 132/84 (BP Location: Left Arm, Patient Position: Sitting, Cuff Size: Large) Comment (BP  Location): forearm  Pulse 94   Resp 20   Ht 6\' 1"  (1.854 m)   Wt (!) 395 lb (179.2 kg)   SpO2 96% Comment: RA  BMI 52.11 kg/m              General:                      Morbidly obese,  well-appearing, walks with cane             HEENT:                       Unremarkable, NCAT, PERLA, EOMI             Neck:                           no JVD, no bruits, no adenopathy              Chest:                          clear to auscultation, symmetrical breath sounds, no wheezes, no rhonchi              CV:                              RRR, grade lll/VI crescendo/decrescendo murmur heard best at RSB,  no diastolic murmur             Abdomen:                    soft, non-tender, no masses              Extremities:                 warm, well-perfused, pulses palpable in feet, no LE edema             Rectal/GU                   Deferred             Neuro:                         Grossly non-focal and symmetrical throughout             Skin:                            Clean and dry, no rashes, no breakdown   Diagnostic Tests:  ECHOCARDIOGRAM REPORT       Patient Name:  DELON LEN Date of Exam: 05/16/2020  Medical Rec #: [id number]    Height:    73.0 in  Accession #:  [id number]   Weight:    419.0 lb  Date of Birth: 12-11-1955    BSA:     2.946 m  Patient Age:  80 years    BP:      110/80 mmHg  Patient Gender: M  HR:      84 bpm.  Exam Location: [address]   Procedure: 2D Echo, Cardiac Doppler, Color Doppler and Intracardiac       Opacification Agent   Indications:  I50.41 CHF    History:    Patient has prior history of Echocardiogram examinations.  CHF;         Risk Factors:Hypertension, Dyslipidemia and Sleep Apnea.         Leukemia.    Sonographer:  Jessee Avers, RDCS  Referring Phys: [id number] [place]     Sonographer Comments: Technically difficult study due to poor  echo windows  and suboptimal apical window. Image acquisition challenging due to patient  body habitus.  IMPRESSIONS    1. Left ventricular ejection fraction, by estimation, is 30 to 35%. The  left ventricle has moderately decreased function. The left ventricle  demonstrates regional wall motion abnormalities (see scoring  diagram/findings for description). Left ventricular  diastolic parameters are consistent with Grade I diastolic dysfunction  (impaired relaxation).  2. Right ventricular systolic function is normal. The right ventricular  size is normal.  3. The mitral valve is normal in structure. No evidence of mitral valve  regurgitation.  4. The aortic valve is calcified. There is moderate calcification of the  aortic valve. There is moderate thickening of the aortic valve. Aortic  valve regurgitation is mild. Moderate to severe aortic valve stenosis.   FINDINGS  Left Ventricle: Left ventricular ejection fraction, by estimation, is 30  to 35%. The left ventricle has moderately decreased function. The left  ventricle demonstrates regional wall motion abnormalities. Severe  hypokinesis of the left ventricular,  mid-apical anteroseptal wall, lateral wall and apical segment. Definity  contrast agent was given IV to delineate the left ventricular endocardial  borders. Left ventricular diastolic parameters are consistent with Grade I  diastolic dysfunction (impaired  relaxation).   Right Ventricle: The right ventricular size is normal. Right ventricular  systolic function is normal.   Left Atrium: Left atrial size was normal in size.   Right Atrium: Right atrial size was normal in size.   Mitral Valve: The mitral valve is normal in structure.   Tricuspid Valve: The tricuspid valve is normal in structure. Tricuspid  valve regurgitation is not demonstrated.   Aortic Valve: The aortic valve is calcified. There is moderate  calcification of the aortic valve. There is  moderate thickening of the  aortic valve. There is moderate aortic valve annular calcification. Aortic  valve regurgitation is mild. Moderate to  severe aortic stenosis is present. Aortic valve mean gradient measures  36.0 mmHg. Aortic valve peak gradient measures 61.5 mmHg. Aortic valve  area, by VTI measures 1.89 cm.   IAS/Shunts: The atrial septum is grossly normal.   LEFT VENTRICLE  PLAX 2D  LVIDd:     6.70 cm Diastology  LVIDs:     4.40 cm LV e' medial:  3.85 cm/s  LV PW:     1.10 cm LV E/e' medial: 14.2  LV IVS:    1.00 cm LV e' lateral:  11.10 cm/s  LVOT diam:   2.70 cm LV E/e' lateral: 4.9  LV SV:     150  LV SV Index:  51  LVOT Area:   5.73 cm     RIGHT VENTRICLE  RV Basal diam: 3.50 cm  RV S prime:   8.31 cm/s  TAPSE (M-mode): 2.6 cm   LEFT ATRIUM       Index  RIGHT ATRIUM      Index  LA diam:    4.60 cm 1.56 cm/m RA Pressure: 3.00 mmHg  LA Vol (A2C):  60.3 ml 20.47 ml/m RA Area:   14.20 cm  LA Vol (A4C):  57.3 ml 19.45 ml/m RA Volume:  33.70 ml 11.44 ml/m  LA Biplane Vol: 58.8 ml 19.96 ml/m  AORTIC VALVE  AV Area (Vmax):  1.89 cm  AV Area (Vmean):  1.80 cm  AV Area (VTI):   1.89 cm  AV Vmax:      392.25 cm/s  AV Vmean:     278.750 cm/s  AV VTI:      0.792 m  AV Peak Grad:   61.5 mmHg  AV Mean Grad:   36.0 mmHg  LVOT Vmax:     129.50 cm/s  LVOT Vmean:    87.450 cm/s  LVOT VTI:     0.262 m  LVOT/AV VTI ratio: 0.33    AORTA  Ao Root diam: 3.60 cm   MITRAL VALVE        TRICUSPID VALVE               Estimated RAP: 3.00 mmHg    MV E velocity: 54.80 cm/s SHUNTS  MV A velocity: 90.00 cm/s Systemic VTI: 0.26 m  MV E/A ratio: 0.61    Systemic Diam: 2.70 cm   Mertie Moores MD  Electronically signed by Mertie Moores MD  Signature Date/Time: 05/16/2020/3:03:42 PM     Physicians  Panel Physicians Referring  Physician Case Authorizing Physician  Sherren Mocha, MD (Primary)      Procedures  RIGHT/LEFT HEART CATH AND CORONARY ANGIOGRAPHY   Conclusion  1. Angiographically normal coronary arteries (left dominant) 2. Essentially normal right heart pressures and hemodynamics with high cardiac output 3. Calcified, restricted aortic valve with a peak to peak gradient 52 mmHg, mean gradient 37 mmHg, calculated aortic valve area 1.7 cm.  Recommendations: Suspected severe aortic stenosis, transvalvular gradients are in the severe range and valve area is likely erroneous with this patient's high cardiac output. Recommend multidisciplinary heart team evaluation for consideration of TAVR versus surgical AVR.   Indications  Severe aortic stenosis [I35.0 (ICD-10-CM)]   Procedural Details  Technical Details INDICATION: CHF, aortic stenosis. 400 pound gentleman with CHF, LV dysfunction, and what appears to be severe aortic stenosis (echo imaging difficult). He is referred for R/L heart cath.  PROCEDURAL DETAILS: Using direct ultrasound guidance, an antecubital vein is accessed successfully with a micropuncture needle. A 7 French sheath is inserted. Ultrasound images are digitally captured and stored in the patient's chart. The right wrist was then prepped, draped, and anesthetized with 1% lidocaine. Using ultrasound guidance and the modified Seldinger technique a 5/6 French Slender sheath was placed in the right radial artery. Intra-arterial verapamil was administered through the radial artery sheath. IV heparin was administered after a JR4 catheter was advanced into the central aorta. A Swan-Ganz catheter was used for the right heart catheterization. Standard protocol was followed for recording of right heart pressures and sampling of oxygen saturations. Fick cardiac output was calculated. Standard Judkins catheters were used for selective coronary angiography. LV pressure is recorded and  an aortic valve pullback is performed. There were no immediate procedural complications. The patient was transferred to the post catheterization recovery area for further monitoring.    Estimated blood loss <50 mL.   During this procedure medications were administered to achieve and maintain moderate conscious sedation while the patient's heart rate, blood pressure,  and oxygen saturation were continuously monitored and I was present face-to-face 100% of this time.   Medications (Filter: Administrations occurring from 1258 to 1411 on 05/25/20) (important) Continuous medications are totaled by the amount administered until 05/25/20 1411.    Heparin (Porcine) in NaCl 1000-0.9 UT/500ML-% SOLN (mL) Total volume:  1,000 mL  Date/Time Rate/Dose/Volume Action   05/25/20 1314 500 mL Given   1314 500 mL Given    midazolam (VERSED) injection (mg) Total dose:  2 mg  Date/Time Rate/Dose/Volume Action   05/25/20 1319 1 mg Given   1335 1 mg Given    fentaNYL (SUBLIMAZE) injection (mcg) Total dose:  50 mcg  Date/Time Rate/Dose/Volume Action   05/25/20 1319 25 mcg Given   1335 25 mcg Given    lidocaine (PF) (XYLOCAINE) 1 % injection (mL) Total volume:  4 mL  Date/Time Rate/Dose/Volume Action   05/25/20 1336 2 mL Given   1338 2 mL Given    Radial Cocktail/Verapamil only (mL) Total volume:  10 mL  Date/Time Rate/Dose/Volume Action   05/25/20 1338 10 mL Given    heparin sodium (porcine) injection (Units) Total dose:  8,000 Units  Date/Time Rate/Dose/Volume Action   05/25/20 1348 8,000 Units Given    iohexol (OMNIPAQUE) 350 MG/ML injection (mL) Total volume:  50 mL  Date/Time Rate/Dose/Volume Action   05/25/20 1359 50 mL Given    Contrast  Medication Name Total Dose  iohexol (OMNIPAQUE) 350 MG/ML injection 50 mL    Radiation/Fluoro  Fluoro time: 6.3 (min) DAP: 27.8 (Gycm2) Cumulative Air Kerma: 362.6 (mGy)   Coronary  Findings   Diagnostic Dominance: Left  Left Main  Vessel is large.  Left Anterior Descending  Vessel is angiographically normal.  Right Coronary Artery  Vessel is small.   Intervention   No interventions have been documented.  Left Heart  Aortic Valve There is severe aortic valve stenosis. The aortic valve is calcified. There is restricted aortic valve motion.   Coronary Diagrams   Diagnostic Dominance: Left    Intervention    Implants       No implant documentation for this case.   Syngo Images  Show images for CARDIAC CATHETERIZATION  Images on Long Term Storage  Show images for [place], Eichmann to Procedure Log  Procedure Log     Hemo Data  Flowsheet Row Most Recent Value  Fick Cardiac Output 9.69 L/min  Fick Cardiac Output Index 3.34 (L/min)/BSA  Aortic Mean Gradient 37.43 mmHg  Aortic Peak Gradient 52 mmHg  Aortic Valve Area 1.70  Aortic Value Area Index 0.59 cm2/BSA  RA A Wave 6 mmHg  RA V Wave 6 mmHg  RA Mean 4 mmHg  RV Systolic Pressure 41 mmHg  RV Diastolic Pressure 5 mmHg  RV EDP 9 mmHg  PA Systolic Pressure 31 mmHg  PA Diastolic Pressure 18 mmHg  PA Mean 22 mmHg  PW A Wave 19 mmHg  PW V Wave 17 mmHg  PW Mean 16 mmHg  AO Systolic Pressure [id number] mmHg  AO Diastolic Pressure 62 mmHg  AO Mean 82 mmHg  LV Systolic Pressure [id number] mmHg  LV Diastolic Pressure 6 mmHg  LV EDP 10 mmHg  AOp Systolic Pressure 83 mmHg  AOp Diastolic Pressure 62 mmHg  AOp Mean Pressure 72 mmHg  LVp Systolic Pressure [id number] mmHg  LVp Diastolic Pressure 8 mmHg  LVp EDP Pressure 15 mmHg  QP/QS 1.1  TPVR Index 6.58 HRUI  TSVR Index 24.54 HRUI  PVR SVR Ratio 0.08  TPVR/TSVR Ratio 0.27   ADDENDUM REPORT: 06/07/2020 23:44  CLINICAL DATA: 72 -year-old male with severe aortic stenosis being evaluated for a TAVR procedure.  EXAM: Cardiac TAVR CT  TECHNIQUE: The patient was scanned on a [employer]. A 120  kV retrospective scan was triggered in the descending thoracic aorta at 111 HU's. Gantry rotation speed was 250 msecs and collimation was .6 mm. No beta blockade or nitro were given. The 3D data set was reconstructed in 5% intervals of the R-R cycle. Systolic and diastolic phases were analyzed on a dedicated work station using MPR, MIP and VRT modes. The patient received 80 cc of contrast.  FINDINGS: Aortic Root:  Aortic valve: Trileaflet  Aortic valve calcium score: 3409  Aortic annulus:  Diameter: 35mm x 92mm  Perimeter: 50mm  Area: 664 mm^2  Calcifications: Mild calcification adjacent to [place]  Coronary height: Min Left - 41mm, Max Left - 14mm; Min Right - 39mm  Sinotubular height: Left cusp - 25mm; Right cusp - 53mm; Noncoronary cusp - 75mm  LVOT (as measured 3 mm below the annulus):  Diameter: 31mm x 68mm  Area: 734 mm^2  Calcifications: No calcifications  Aortic sinus width: Left cusp - 89mm; Right cusp - 62mm; Noncoronary cusp - 46mm  Sinotubular junction width: 36mm x 107mm  Optimum Fluoroscopic Angle for Delivery: RAO 30 CAU 8  Cardiac:  Right atrium: Normal size  Right ventricle: Normal size  Pulmonary arteries: Normal size  Pulmonary veins: Normal configuration  Left atrium: Moderate enlargement  Left ventricle: Moderately dilated  Pericardium: Normal thickness  Coronary arteries: Calcium score 195 (64th percentile)  IMPRESSION: 1. Poor image quality, likely due to morbid obesity causing high image noise  2. Trileaflet aortic valve with severe calcifications (AV calcium score 3409)  3. Aortic annulus measures 31mm x 66mm in diameter with perimeter 53mm and area 664 mm^2. Mild annular calcifications adjacent to [place]. Annular measurements suitable for delivery of 76mm Edwards Sapien 3 valve  4. Sufficient coronary to annulus distance, measuring 23mm to left main and 79mm to RCA  5. Optimum Fluoroscopic  Angle for Delivery: RAO 30 CAU 8  6. Coronary calcium score 195 (64th percentile)   Electronically Signed By: Oswaldo Milian MD On: 06/07/2020 23:44   Addended by Donato Heinz, MD on 06/07/2020 11:46 PM    Study Result  Narrative & Impression  EXAM: OVER-READ INTERPRETATION CT CHEST  The following report is an over-read performed by radiologist Dr. Vinnie Langton of [facility], [place] on 06/05/2020. This over-read does not include interpretation of cardiac or coronary anatomy or pathology. The coronary calcium score/coronary CTA interpretation by the cardiologist is attached.  COMPARISON: None.  FINDINGS: Extracardiac findings will be described separately under dictation for contemporaneously obtained CTA chest, abdomen and pelvis.  IMPRESSION: Please see separate dictation for contemporaneously obtained CTA chest, abdomen and pelvis dated 06/05/2020 for full description of relevant extracardiac findings.  Electronically Signed: By: Vinnie Langton M.D. On: 06/05/2020 11:56     Narrative & Impression  CLINICAL DATA: 65 year old male with history of severe aortic stenosis. Preprocedural study prior to potential transcatheter aortic valve replacement (TAVR) procedure.  EXAM: CT ANGIOGRAPHY CHEST, ABDOMEN AND PELVIS  TECHNIQUE: Non-contrast CT of the chest was initially obtained.  Multidetector CT imaging through the chest, abdomen and pelvis was performed using the standard protocol during bolus administration of intravenous contrast. Multiplanar reconstructed images and MIPs were obtained and reviewed to evaluate the vascular anatomy.  CONTRAST: 129mL OMNIPAQUE IOHEXOL 350 MG/ML SOLN  COMPARISON: No priors.  FINDINGS: CTA CHEST FINDINGS  Cardiovascular: Heart size is borderline enlarged with concentric left ventricular hypertrophy. There is no significant pericardial fluid, thickening or  pericardial calcification. There is aortic atherosclerosis, as well as atherosclerosis of the great vessels of the mediastinum and the coronary arteries, including calcified atherosclerotic plaque in the left anterior descending and left circumflex coronary arteries. Severe calcifications of the aortic valve.  Mediastinum/Lymph Nodes: No pathologically enlarged mediastinal or hilar lymph nodes. Please note that accurate exclusion of hilar adenopathy is limited on noncontrast CT scans. Esophagus is unremarkable in appearance. No axillary lymphadenopathy.  Lungs/Pleura: No acute consolidative airspace disease. No pleural effusions. No suspicious appearing pulmonary nodules or masses are noted. Areas of lucency interspersed with areas of ground-glass attenuation with intervening geographic margins, strongly suggestive of air trapping from small airways disease.  Musculoskeletal/Soft Tissues: There are no aggressive appearing lytic or blastic lesions noted in the visualized portions of the skeleton.  CTA ABDOMEN AND PELVIS FINDINGS  Hepatobiliary: No suspicious cystic or solid hepatic lesions. No intra or extrahepatic biliary ductal dilatation. Mild diffuse low attenuation throughout the hepatic parenchyma, indicative of hepatic steatosis. Status post cholecystectomy.  Pancreas: No pancreatic mass. No pancreatic ductal dilatation. No pancreatic or peripancreatic fluid collections or inflammatory changes.  Spleen: Calcified granulomas in the spleen. Otherwise, unremarkable.  Adrenals/Urinary Tract: Bilateral kidneys and adrenal glands are normal in appearance. No hydroureteronephrosis. Urinary bladder is normal in appearance.  Stomach/Bowel: The appearance of the stomach is normal. No pathologic dilatation of small bowel or colon. Normal appendix.  Vascular/Lymphatic: Aortic atherosclerosis, without evidence of aneurysm or dissection in the abdominal or pelvic  vasculature. Vascular findings and measurements pertinent to potential TAVR procedure, as detailed below. No lymphadenopathy noted in the abdomen or pelvis.  Reproductive: Prostate gland and seminal vesicles are unremarkable in appearance.  Other: No significant volume of ascites. No pneumoperitoneum.  Musculoskeletal: There are no aggressive appearing lytic or blastic lesions noted in the visualized portions of the skeleton.  VASCULAR MEASUREMENTS PERTINENT TO TAVR:  Comment: Today's study is of very poor technical quality secondary to suboptimal contrast bolus and the patient's large body habitus which creates excessive image noise. As a result, vascular findings and measurements below are best estimates given that image quality was considered nearly nondiagnostic.  AORTA:  Minimal Aortic Diameter-22 x 18 mm  Severity of Aortic Calcification-mild  RIGHT PELVIS:  Right Common Iliac Artery -  Minimal Diameter-12.7 x 13.4 mm  Tortuosity-mild  Calcification-mild  Right External Iliac Artery -  Minimal Diameter-8.0 x 6.8 mm  Tortuosity-severe  Calcification-none  Right Common Femoral Artery -  Minimal Diameter-8.1 x 6.3 mm  Tortuosity-mild  Calcification-mild  LEFT PELVIS:  Left Common Iliac Artery -  Minimal Diameter-14.1 x 13.3 mm  Tortuosity-mild  Calcification-none  Left External Iliac Artery -  Minimal Diameter-9.5 x 8.0 mm  Tortuosity-severe  Calcification-none  Left Common Femoral Artery -  Minimal Diameter-9.4 x 8.2 mm  Tortuosity-mild  Calcification-none  Review of the MIP images confirms the above findings.  IMPRESSION: 1. Vascular findings and measurements pertinent to potential TAVR procedure, as detailed above. 2. Severe thickening and calcification of the aortic valve, compatible with reported clinical history of severe aortic stenosis. 3. Concentric left ventricular hypertrophy. 4.  Evidence of air trapping in the lungs, suggestive of small airways disease. 5. Hepatic steatosis. 6. Additional incidental findings, as above.   Electronically Signed By: Vinnie Langton M.D. On: 06/06/2020 05:52     Procedure: Isolated AVR  Risk of Mortality:  2.256%  Renal Failure:  3.722%  Permanent Stroke:  0.502%  Prolonged Ventilation:  11.627%  DSW Infection:  0.267%  Reoperation:  3.344%  Morbidity or Mortality:  16.105%  Short Length of Stay:  30.133%  Long Length of Stay:  7.026%   Impression:  The patient is a 65 year old gentleman who presents with stage D, severe, symptomatic aortic stenosis with New York Heart Association class III symptoms of exertional fatigue and shortness of breath as well as dizziness, lower extremity edema and orthopnea.  I have personally reviewed his 2D echocardiogram, cardiac catheterization, and CTA studies.  2D echocardiogram shows an ejection fraction of 30 to 35% which is slightly decreased from September 2021.  Cardiac catheterization shows normal coronary arteries.  It appears that his chronic combined systolic and diastolic congestive heart failure are related to valvular heart disease.  He may also have obesity hypoventilation syndrome with a PCO2 of 52 on his room air arterial blood gas.  I think the best treatment for him is aortic valve replacement for relief of his symptoms and to prevent progressive left ventricular deterioration.  Given his morbid obesity and comorbid risk factors I think the best option would be to perform transcatheter aortic valve replacement.  His gated cardiac CTA shows anatomy suitable for TAVR using a SAPIEN 3 valve.  His abdominal and pelvic CTA appears to show adequate pelvic vascular anatomy to allow transfemoral insertion.  The patient was counseled at length regarding treatment alternatives for management of severe symptomatic aortic stenosis. The risks and benefits of surgical  intervention has been discussed in detail.  I think he would have a very slow and difficult recovery following open aortic valve replacement.  Long-term prognosis with medical therapy was discussed. Alternative approaches such as conventional surgical aortic valve replacement, transcatheter aortic valve replacement, and palliative medical therapy were compared and contrasted at length. This discussion was placed in the context of the patient's own specific clinical presentation and past medical history. All of his questions have been addressed.   Following the decision to proceed with transcatheter aortic valve replacement, a discussion was held regarding what types of management strategies would be attempted intraoperatively in the event of life-threatening complications, including whether or not the patient would be considered a candidate for the use of cardiopulmonary bypass and/or conversion to open sternotomy for attempted surgical intervention.  I think he would be a candidate for emergent sternotomy if needed to manage any intraoperative complications although his operative risk would certainly be significantly increased due to his morbid obesity.  The patient is aware of the fact that transient use of cardiopulmonary bypass may be necessary. The patient has been advised of a variety of complications that might develop including but not limited to risks of death, stroke, paravalvular leak, aortic dissection or other major vascular complications, aortic annulus rupture, device embolization, cardiac rupture or perforation, mitral regurgitation, acute myocardial infarction, arrhythmia, heart block or bradycardia requiring permanent pacemaker placement, congestive heart failure, respiratory failure, renal failure, pneumonia, infection, other late complications related to structural valve deterioration or migration, or other complications that might ultimately cause a temporary or permanent loss of functional  independence or other long term morbidity. The patient provides full informed consent for the procedure as described and all questions were answered.    Plan:  Transfemoral transcatheter aortic valve replacement using a SAPIEN 3 valve.    Gaye Pollack, MD

## 2020-06-27 ENCOUNTER — Other Ambulatory Visit: Payer: Self-pay

## 2020-06-27 ENCOUNTER — Inpatient Hospital Stay (HOSPITAL_COMMUNITY)
Admission: RE | Admit: 2020-06-27 | Discharge: 2020-06-28 | DRG: 267 | Disposition: A | Discharge status: Home / Self Care | Payer: Medicare Other | Attending: Cardiovascular Disease | Admitting: Cardiovascular Disease

## 2020-06-27 ENCOUNTER — Encounter (HOSPITAL_COMMUNITY)
Admission: RE | Disposition: A | Discharge status: Home / Self Care | Payer: Self-pay | Source: Home / Self Care | Attending: Cardiovascular Disease

## 2020-06-27 ENCOUNTER — Inpatient Hospital Stay (HOSPITAL_COMMUNITY): Payer: Medicare Other | Admitting: Physician Assistant

## 2020-06-27 ENCOUNTER — Other Ambulatory Visit: Payer: Self-pay | Admitting: Physician Assistant

## 2020-06-27 ENCOUNTER — Encounter (HOSPITAL_COMMUNITY): Payer: Self-pay | Admitting: Cardiovascular Disease

## 2020-06-27 ENCOUNTER — Inpatient Hospital Stay (HOSPITAL_COMMUNITY): Payer: Medicare Other | Admitting: Certified Registered"

## 2020-06-27 ENCOUNTER — Other Ambulatory Visit (HOSPITAL_COMMUNITY): Payer: Self-pay | Admitting: *Deleted

## 2020-06-27 ENCOUNTER — Inpatient Hospital Stay (HOSPITAL_COMMUNITY): Payer: Medicare Other

## 2020-06-27 DIAGNOSIS — G8929 Other chronic pain: Secondary | ICD-10-CM | POA: Diagnosis present

## 2020-06-27 DIAGNOSIS — F32A Depression, unspecified: Secondary | ICD-10-CM | POA: Diagnosis present

## 2020-06-27 DIAGNOSIS — Z952 Presence of prosthetic heart valve: Secondary | ICD-10-CM | POA: Diagnosis not present

## 2020-06-27 DIAGNOSIS — I5042 Chronic combined systolic (congestive) and diastolic (congestive) heart failure: Secondary | ICD-10-CM | POA: Diagnosis present

## 2020-06-27 DIAGNOSIS — I350 Nonrheumatic aortic (valve) stenosis: Secondary | ICD-10-CM

## 2020-06-27 DIAGNOSIS — C9591 Leukemia, unspecified, in remission: Secondary | ICD-10-CM | POA: Diagnosis present

## 2020-06-27 DIAGNOSIS — M170 Bilateral primary osteoarthritis of knee: Secondary | ICD-10-CM | POA: Diagnosis present

## 2020-06-27 DIAGNOSIS — C9590 Leukemia, unspecified not having achieved remission: Secondary | ICD-10-CM | POA: Diagnosis present

## 2020-06-27 DIAGNOSIS — Z006 Encounter for examination for normal comparison and control in clinical research program: Secondary | ICD-10-CM

## 2020-06-27 DIAGNOSIS — Z801 Family history of malignant neoplasm of trachea, bronchus and lung: Secondary | ICD-10-CM

## 2020-06-27 DIAGNOSIS — Z7982 Long term (current) use of aspirin: Secondary | ICD-10-CM

## 2020-06-27 DIAGNOSIS — Z79899 Other long term (current) drug therapy: Secondary | ICD-10-CM | POA: Diagnosis not present

## 2020-06-27 DIAGNOSIS — E785 Hyperlipidemia, unspecified: Secondary | ICD-10-CM | POA: Diagnosis present

## 2020-06-27 DIAGNOSIS — Z808 Family history of malignant neoplasm of other organs or systems: Secondary | ICD-10-CM | POA: Diagnosis not present

## 2020-06-27 DIAGNOSIS — F419 Anxiety disorder, unspecified: Secondary | ICD-10-CM | POA: Diagnosis present

## 2020-06-27 DIAGNOSIS — I110 Hypertensive heart disease with heart failure: Secondary | ICD-10-CM | POA: Diagnosis present

## 2020-06-27 DIAGNOSIS — Z9221 Personal history of antineoplastic chemotherapy: Secondary | ICD-10-CM

## 2020-06-27 DIAGNOSIS — E662 Morbid (severe) obesity with alveolar hypoventilation: Secondary | ICD-10-CM | POA: Diagnosis present

## 2020-06-27 DIAGNOSIS — I10 Essential (primary) hypertension: Secondary | ICD-10-CM | POA: Diagnosis present

## 2020-06-27 DIAGNOSIS — I5020 Unspecified systolic (congestive) heart failure: Secondary | ICD-10-CM | POA: Diagnosis present

## 2020-06-27 DIAGNOSIS — Z6841 Body Mass Index (BMI) 40.0 and over, adult: Secondary | ICD-10-CM | POA: Diagnosis not present

## 2020-06-27 DIAGNOSIS — Z856 Personal history of leukemia: Secondary | ICD-10-CM

## 2020-06-27 HISTORY — DX: Presence of prosthetic heart valve: Z95.2

## 2020-06-27 HISTORY — PX: ULTRASOUND GUIDANCE FOR VASCULAR ACCESS: SHX6516

## 2020-06-27 HISTORY — PX: INTRAOPERATIVE TRANSTHORACIC ECHOCARDIOGRAM: SHX6523

## 2020-06-27 HISTORY — DX: Morbid (severe) obesity due to excess calories: E66.01

## 2020-06-27 HISTORY — PX: TRANSCATHETER AORTIC VALVE REPLACEMENT, TRANSFEMORAL: SHX6400

## 2020-06-27 LAB — POCT I-STAT, CHEM 8
BUN: 20 mg/dL (ref 8–23)
BUN: 21 mg/dL (ref 8–23)
BUN: 17 mg/dL (ref 8–23)
Calcium, Ion: 1.25 mmol/L (ref 1.15–1.40)
Calcium, Ion: 1.21 mmol/L (ref 1.15–1.40)
Calcium, Ion: 1.28 mmol/L (ref 1.15–1.40)
Chloride: 100 mmol/L (ref 98–111)
Chloride: 102 mmol/L (ref 98–111)
Chloride: 101 mmol/L (ref 98–111)
Creatinine, Ser: 1.1 mg/dL (ref 0.61–1.24)
Creatinine, Ser: 1 mg/dL (ref 0.61–1.24)
Creatinine, Ser: 1.1 mg/dL (ref 0.61–1.24)
Glucose, Bld: 130 mg/dL — ABNORMAL HIGH (ref 70–99)
Glucose, Bld: 134 mg/dL — ABNORMAL HIGH (ref 70–99)
Glucose, Bld: 120 mg/dL — ABNORMAL HIGH (ref 70–99)
HCT: 36 % — ABNORMAL LOW (ref 39.0–52.0)
HCT: 33 % — ABNORMAL LOW (ref 39.0–52.0)
HCT: 35 % — ABNORMAL LOW (ref 39.0–52.0)
Hemoglobin: 12.2 g/dL — ABNORMAL LOW (ref 13.0–17.0)
Hemoglobin: 11.2 g/dL — ABNORMAL LOW (ref 13.0–17.0)
Hemoglobin: 11.9 g/dL — ABNORMAL LOW (ref 13.0–17.0)
Potassium: 4 mmol/L (ref 3.5–5.1)
Potassium: 3.9 mmol/L (ref 3.5–5.1)
Potassium: 3.9 mmol/L (ref 3.5–5.1)
Sodium: 140 mmol/L (ref 135–145)
Sodium: 140 mmol/L (ref 135–145)
Sodium: 140 mmol/L (ref 135–145)
TCO2: 31 mmol/L (ref 22–32)
TCO2: 30 mmol/L (ref 22–32)
TCO2: 26 mmol/L (ref 22–32)

## 2020-06-27 LAB — ECHOCARDIOGRAM LIMITED
AR max vel: 1.38 cm2
AV Area VTI: 1.22 cm2
AV Area mean vel: 1.28 cm2
AV Mean grad: 21 mmHg
AV Peak grad: 30.8 mmHg
Ao pk vel: 2.77 m/s

## 2020-06-27 LAB — ABO/RH: ABO/RH(D): O POS

## 2020-06-27 LAB — GLUCOSE, CAPILLARY
Glucose-Capillary: 82 mg/dL (ref 70–99)
Glucose-Capillary: 103 mg/dL — ABNORMAL HIGH (ref 70–99)

## 2020-06-27 LAB — POCT I-STAT 7, (LYTES, BLD GAS, ICA,H+H)
Acid-Base Excess: 6 mmol/L — ABNORMAL HIGH (ref 0.0–2.0)
Bicarbonate: 33.2 mmol/L — ABNORMAL HIGH (ref 20.0–28.0)
Calcium, Ion: 1.25 mmol/L (ref 1.15–1.40)
HCT: 36 % — ABNORMAL LOW (ref 39.0–52.0)
Hemoglobin: 12.2 g/dL — ABNORMAL LOW (ref 13.0–17.0)
O2 Saturation: 100 %
Potassium: 4 mmol/L (ref 3.5–5.1)
Sodium: 140 mmol/L (ref 135–145)
TCO2: 35 mmol/L — ABNORMAL HIGH (ref 22–32)
pCO2 arterial: 59.2 mmHg — ABNORMAL HIGH (ref 32.0–48.0)
pH, Arterial: 7.357 (ref 7.350–7.450)
pO2, Arterial: 235 mmHg — ABNORMAL HIGH (ref 83.0–108.0)

## 2020-06-27 SURGERY — IMPLANTATION, AORTIC VALVE, TRANSCATHETER, FEMORAL APPROACH
Anesthesia: Monitor Anesthesia Care | Site: Groin

## 2020-06-27 MED ORDER — CLOPIDOGREL BISULFATE 75 MG PO TABS
75.0000 mg | ORAL_TABLET | Freq: Every day | ORAL | Status: DC
  Administered 2020-06-28: 75 mg via ORAL
  Filled 2020-06-27: qty 1

## 2020-06-27 MED ORDER — TAMSULOSIN HCL 0.4 MG PO CAPS
0.4000 mg | ORAL_CAPSULE | Freq: Every day | ORAL | Status: DC
  Administered 2020-06-27 – 2020-06-28 (×2): 0.4 mg via ORAL
  Filled 2020-06-27 (×2): qty 1

## 2020-06-27 MED ORDER — PHENYLEPHRINE 40 MCG/ML (10ML) SYRINGE FOR IV PUSH (FOR BLOOD PRESSURE SUPPORT)
PREFILLED_SYRINGE | INTRAVENOUS | Status: DC | PRN
  Administered 2020-06-27 (×2): 80 ug via INTRAVENOUS

## 2020-06-27 MED ORDER — SODIUM CHLORIDE 0.9 % IV SOLN: INTRAVENOUS | Status: DC

## 2020-06-27 MED ORDER — MIDAZOLAM HCL 5 MG/5ML IJ SOLN
INTRAMUSCULAR | Status: DC | PRN
  Administered 2020-06-27: 2 mg via INTRAVENOUS

## 2020-06-27 MED ORDER — PHENYLEPHRINE HCL-NACL 20-0.9 MG/250ML-% IV SOLN: 0.0000 ug/min | INTRAVENOUS | Status: DC

## 2020-06-27 MED ORDER — FENTANYL CITRATE (PF) 100 MCG/2ML IJ SOLN
INTRAMUSCULAR | Status: DC | PRN
  Administered 2020-06-27: 25 ug via INTRAVENOUS
  Administered 2020-06-27: 50 ug via INTRAVENOUS

## 2020-06-27 MED ORDER — DIAZEPAM 5 MG PO TABS: 10.0000 mg | ORAL_TABLET | Freq: Every day | ORAL | Status: DC | PRN

## 2020-06-27 MED ORDER — PHENYLEPHRINE HCL-NACL 20-0.9 MG/250ML-% IV SOLN
0.0000 ug/min | INTRAVENOUS | Status: DC
  Filled 2020-06-27: qty 250

## 2020-06-27 MED ORDER — PROTAMINE SULFATE 10 MG/ML IV SOLN
INTRAVENOUS | Status: DC | PRN
  Administered 2020-06-27: 10 mg via INTRAVENOUS
  Administered 2020-06-27: 270 mg via INTRAVENOUS

## 2020-06-27 MED ORDER — CHLORHEXIDINE GLUCONATE 0.12 % MT SOLN: 15.0000 mL | Freq: Once | OROMUCOSAL | Status: AC

## 2020-06-27 MED ORDER — ONDANSETRON HCL 4 MG/2ML IJ SOLN
INTRAMUSCULAR | Status: DC | PRN
  Administered 2020-06-27: 4 mg via INTRAVENOUS

## 2020-06-27 MED ORDER — ACETAMINOPHEN 325 MG PO TABS
650.0000 mg | ORAL_TABLET | Freq: Four times a day (QID) | ORAL | Status: DC | PRN
  Administered 2020-06-27 – 2020-06-28 (×2): 650 mg via ORAL
  Filled 2020-06-27 (×2): qty 2

## 2020-06-27 MED ORDER — MIDAZOLAM HCL 2 MG/2ML IJ SOLN
INTRAMUSCULAR | Status: AC
  Filled 2020-06-27: qty 2

## 2020-06-27 MED ORDER — CHLORHEXIDINE GLUCONATE 0.12 % MT SOLN
OROMUCOSAL | Status: AC
  Administered 2020-06-27: 15 mL via OROMUCOSAL
  Filled 2020-06-27: qty 15

## 2020-06-27 MED ORDER — PROPOFOL 500 MG/50ML IV EMUL
INTRAVENOUS | Status: DC | PRN
  Administered 2020-06-27: 20 ug/kg/min via INTRAVENOUS

## 2020-06-27 MED ORDER — ORAL CARE MOUTH RINSE: 15.0000 mL | Freq: Once | OROMUCOSAL | Status: AC

## 2020-06-27 MED ORDER — TRAMADOL HCL 50 MG PO TABS: 50.0000 mg | ORAL_TABLET | ORAL | Status: DC | PRN

## 2020-06-27 MED ORDER — DULOXETINE HCL 60 MG PO CPEP
60.0000 mg | ORAL_CAPSULE | Freq: Every day | ORAL | Status: DC
  Administered 2020-06-27 – 2020-06-28 (×2): 60 mg via ORAL
  Filled 2020-06-27 (×2): qty 1

## 2020-06-27 MED ORDER — 0.9 % SODIUM CHLORIDE (POUR BTL) OPTIME
TOPICAL | Status: DC | PRN
  Administered 2020-06-27: 1000 mL

## 2020-06-27 MED ORDER — ASPIRIN EC 81 MG PO TBEC
81.0000 mg | DELAYED_RELEASE_TABLET | Freq: Every day | ORAL | Status: DC
  Administered 2020-06-27 – 2020-06-28 (×2): 81 mg via ORAL
  Filled 2020-06-27 (×2): qty 1

## 2020-06-27 MED ORDER — ACETAMINOPHEN 650 MG RE SUPP: 650.0000 mg | Freq: Four times a day (QID) | RECTAL | Status: DC | PRN

## 2020-06-27 MED ORDER — OXYCODONE HCL 5 MG PO TABS
5.0000 mg | ORAL_TABLET | ORAL | Status: DC | PRN
  Administered 2020-06-27 – 2020-06-28 (×5): 10 mg via ORAL
  Filled 2020-06-27 (×5): qty 2

## 2020-06-27 MED ORDER — FENTANYL CITRATE (PF) 250 MCG/5ML IJ SOLN
INTRAMUSCULAR | Status: AC
  Filled 2020-06-27: qty 5

## 2020-06-27 MED ORDER — SODIUM CHLORIDE 0.9 % IV SOLN
INTRAVENOUS | Status: DC | PRN
  Administered 2020-06-27 (×3): 500 mL

## 2020-06-27 MED ORDER — LACTATED RINGERS IV SOLN: INTRAVENOUS | Status: DC

## 2020-06-27 MED ORDER — ATORVASTATIN CALCIUM 10 MG PO TABS
20.0000 mg | ORAL_TABLET | Freq: Every day | ORAL | Status: DC
  Administered 2020-06-27: 20 mg via ORAL
  Filled 2020-06-27: qty 2

## 2020-06-27 MED ORDER — VANCOMYCIN HCL IN DEXTROSE 1-5 GM/200ML-% IV SOLN
1000.0000 mg | Freq: Once | INTRAVENOUS | Status: AC
  Administered 2020-06-27: 1000 mg via INTRAVENOUS
  Filled 2020-06-27: qty 200

## 2020-06-27 MED ORDER — BUPROPION HCL ER (SR) 150 MG PO TB12
150.0000 mg | ORAL_TABLET | Freq: Two times a day (BID) | ORAL | Status: DC
  Administered 2020-06-27 – 2020-06-28 (×2): 150 mg via ORAL
  Filled 2020-06-27 (×2): qty 1

## 2020-06-27 MED ORDER — MORPHINE SULFATE (PF) 2 MG/ML IV SOLN: 1.0000 mg | INTRAVENOUS | Status: DC | PRN

## 2020-06-27 MED ORDER — SODIUM CHLORIDE 0.9% FLUSH
3.0000 mL | Freq: Two times a day (BID) | INTRAVENOUS | Status: DC
  Administered 2020-06-27 – 2020-06-28 (×2): 3 mL via INTRAVENOUS

## 2020-06-27 MED ORDER — SODIUM CHLORIDE 0.9 % IV SOLN: 250.0000 mL | INTRAVENOUS | Status: DC | PRN

## 2020-06-27 MED ORDER — NITROGLYCERIN IN D5W 200-5 MCG/ML-% IV SOLN: 0.0000 ug/min | INTRAVENOUS | Status: DC

## 2020-06-27 MED ORDER — SODIUM CHLORIDE 0.9% FLUSH: 3.0000 mL | INTRAVENOUS | Status: DC | PRN

## 2020-06-27 MED ORDER — HEPARIN SODIUM (PORCINE) 1000 UNIT/ML IJ SOLN
INTRAMUSCULAR | Status: DC | PRN
  Administered 2020-06-27: 28000 [IU] via INTRAVENOUS

## 2020-06-27 MED ORDER — INSULIN ASPART 100 UNIT/ML IJ SOLN: 0.0000 [IU] | Freq: Three times a day (TID) | INTRAMUSCULAR | Status: DC

## 2020-06-27 MED ORDER — SODIUM CHLORIDE 0.9 % IV SOLN: 250.0000 mL | INTRAVENOUS | Status: DC

## 2020-06-27 MED ORDER — SODIUM CHLORIDE 0.9 % IV SOLN
1.5000 g | Freq: Two times a day (BID) | INTRAVENOUS | Status: DC
  Administered 2020-06-27 – 2020-06-28 (×2): 1.5 g via INTRAVENOUS
  Filled 2020-06-27 (×3): qty 1.5

## 2020-06-27 MED ORDER — ONDANSETRON HCL 4 MG/2ML IJ SOLN: 4.0000 mg | Freq: Four times a day (QID) | INTRAMUSCULAR | Status: DC | PRN

## 2020-06-27 MED ORDER — SODIUM CHLORIDE 0.9 % IV SOLN: INTRAVENOUS | Status: AC

## 2020-06-27 SURGICAL SUPPLY — 86 items
ADH SKN CLS APL DERMABOND .7 (GAUZE/BANDAGES/DRESSINGS) ×4
APL PRP STRL LF DISP 70% ISPRP (MISCELLANEOUS) ×4
BAG DECANTER FOR FLEXI CONT (MISCELLANEOUS) IMPLANT
BAG SNAP BAND KOVER 36X36 (MISCELLANEOUS) ×5 IMPLANT
BLADE CLIPPER SURG (BLADE) IMPLANT
BLADE STERNUM SYSTEM 6 (BLADE) IMPLANT
BLADE SURG 10 STRL SS (BLADE) IMPLANT
CABLE ADAPT CONN TEMP 6FT (ADAPTER) ×5 IMPLANT
CANISTER SUCT 3000ML PPV (MISCELLANEOUS) IMPLANT
CATH DIAG EXPO 6F AL1 (CATHETERS) IMPLANT
CATH DIAG EXPO 6F VENT PIG 145 (CATHETERS) ×10 IMPLANT
CATH EXTERNAL FEMALE PUREWICK (CATHETERS) IMPLANT
CATH INFINITI 6F AL2 (CATHETERS) IMPLANT
CATH S G BIP PACING (CATHETERS) ×5 IMPLANT
CHLORAPREP W/TINT 26 (MISCELLANEOUS) ×5 IMPLANT
CLIP VESOCCLUDE MED 24/CT (CLIP) IMPLANT
CLIP VESOCCLUDE SM WIDE 24/CT (CLIP) IMPLANT
CLOSURE MYNX CONTROL 6F/7F (Vascular Products) ×2 IMPLANT
CNTNR URN SCR LID CUP LEK RST (MISCELLANEOUS) ×8 IMPLANT
CONT SPEC 4OZ STRL OR WHT (MISCELLANEOUS) ×10
COVER BACK TABLE 80X110 HD (DRAPES) IMPLANT
COVER WAND RF STERILE (DRAPES) ×5 IMPLANT
DECANTER SPIKE VIAL GLASS SM (MISCELLANEOUS) ×5 IMPLANT
DERMABOND ADVANCED (GAUZE/BANDAGES/DRESSINGS) ×1
DERMABOND ADVANCED .7 DNX12 (GAUZE/BANDAGES/DRESSINGS) ×4 IMPLANT
DEVICE CLOSURE PERCLS PRGLD 6F (VASCULAR PRODUCTS) ×8 IMPLANT
DRAPE INCISE IOBAN 66X45 STRL (DRAPES) IMPLANT
DRSG TEGADERM 4X4.75 (GAUZE/BANDAGES/DRESSINGS) ×10 IMPLANT
ELECT CAUTERY BLADE 6.4 (BLADE) IMPLANT
ELECT REM PT RETURN 9FT ADLT (ELECTROSURGICAL) ×10
ELECTRODE REM PT RTRN 9FT ADLT (ELECTROSURGICAL) ×8 IMPLANT
FELT TEFLON 6X6 (MISCELLANEOUS) ×5 IMPLANT
GAUZE SPONGE 4X4 12PLY STRL (GAUZE/BANDAGES/DRESSINGS) ×5 IMPLANT
GLOVE BIO SURGEON STRL SZ7.5 (GLOVE) IMPLANT
GLOVE BIO SURGEON STRL SZ8 (GLOVE) IMPLANT
GLOVE EUDERMIC 7 POWDERFREE (GLOVE) IMPLANT
GLOVE ORTHO TXT STRL SZ7.5 (GLOVE) IMPLANT
GOWN STRL REUS W/ TWL LRG LVL3 (GOWN DISPOSABLE) IMPLANT
GOWN STRL REUS W/ TWL XL LVL3 (GOWN DISPOSABLE) ×4 IMPLANT
GOWN STRL REUS W/TWL LRG LVL3 (GOWN DISPOSABLE)
GOWN STRL REUS W/TWL XL LVL3 (GOWN DISPOSABLE) ×5
GUIDEWIRE SAF TJ AMPL .035X180 (WIRE) ×5 IMPLANT
GUIDEWIRE SAFE TJ AMPLATZ EXST (WIRE) ×5 IMPLANT
INSERT FOGARTY SM (MISCELLANEOUS) IMPLANT
KIT BASIN OR (CUSTOM PROCEDURE TRAY) ×5 IMPLANT
KIT HEART LEFT (KITS) ×5 IMPLANT
KIT SUCTION CATH 14FR (SUCTIONS) IMPLANT
KIT TURNOVER KIT B (KITS) ×5 IMPLANT
LOOP VESSEL MAXI BLUE (MISCELLANEOUS) IMPLANT
LOOP VESSEL MINI RED (MISCELLANEOUS) IMPLANT
NS IRRIG 1000ML POUR BTL (IV SOLUTION) ×5 IMPLANT
PACK ENDO MINOR (CUSTOM PROCEDURE TRAY) ×5 IMPLANT
PAD ARMBOARD 7.5X6 YLW CONV (MISCELLANEOUS) ×10 IMPLANT
PAD ELECT DEFIB RADIOL ZOLL (MISCELLANEOUS) ×5 IMPLANT
PENCIL BUTTON HOLSTER BLD 10FT (ELECTRODE) IMPLANT
PERCLOSE PROGLIDE 6F (VASCULAR PRODUCTS) ×10
POSITIONER HEAD DONUT 9IN (MISCELLANEOUS) ×5 IMPLANT
SET MICROPUNCTURE 5F STIFF (MISCELLANEOUS) ×5 IMPLANT
SHEATH BRITE TIP 7FR 35CM (SHEATH) ×5 IMPLANT
SHEATH PINNACLE 6F 10CM (SHEATH) ×5 IMPLANT
SHEATH PINNACLE 8F 10CM (SHEATH) ×5 IMPLANT
SHIELD RADPAD ABSORB 11X34 (MISCELLANEOUS) ×4 IMPLANT
SLEEVE REPOSITIONING LENGTH 30 (MISCELLANEOUS) ×5 IMPLANT
STOPCOCK MORSE 400PSI 3WAY (MISCELLANEOUS) ×10 IMPLANT
SUT ETHIBOND X763 2 0 SH 1 (SUTURE) IMPLANT
SUT GORETEX CV 4 TH 22 36 (SUTURE) IMPLANT
SUT GORETEX CV4 TH-18 (SUTURE) IMPLANT
SUT MNCRL AB 3-0 PS2 18 (SUTURE) IMPLANT
SUT PROLENE 5 0 C 1 36 (SUTURE) IMPLANT
SUT PROLENE 6 0 C 1 30 (SUTURE) IMPLANT
SUT SILK  1 MH (SUTURE) ×5
SUT SILK 1 MH (SUTURE) ×4 IMPLANT
SUT VIC AB 2-0 CT1 27 (SUTURE)
SUT VIC AB 2-0 CT1 TAPERPNT 27 (SUTURE) IMPLANT
SUT VIC AB 2-0 CTX 36 (SUTURE) IMPLANT
SUT VIC AB 3-0 SH 8-18 (SUTURE) IMPLANT
SYR 50ML LL SCALE MARK (SYRINGE) ×5 IMPLANT
SYR BULB IRRIG 60ML STRL (SYRINGE) IMPLANT
SYR MEDRAD MARK V 150ML (SYRINGE) ×5 IMPLANT
TOWEL GREEN STERILE (TOWEL DISPOSABLE) ×10 IMPLANT
TRANSDUCER W/STOPCOCK (MISCELLANEOUS) ×10 IMPLANT
TRAY FOLEY SLVR 14FR TEMP STAT (SET/KITS/TRAYS/PACK) IMPLANT
TUBE SUCT INTRACARD DLP 20F (MISCELLANEOUS) IMPLANT
VALVE HRT TRANSCATH CERT 29MM (Valve) ×2 IMPLANT
WIRE EMERALD 3MM-J .035X150CM (WIRE) ×5 IMPLANT
WIRE EMERALD 3MM-J .035X260CM (WIRE) ×5 IMPLANT

## 2020-06-27 NOTE — Interval H&P Note (Signed)
History and Physical Interval Note:  06/27/2020 10:40 AM  Dakota Meyer  has presented today for surgery, with the diagnosis of Severe Aortic Stenosis.  The various methods of treatment have been discussed with the patient and family. After consideration of risks, benefits and other options for treatment, the patient has consented to  Procedure(s): TRANSCATHETER AORTIC VALVE REPLACEMENT, TRANSFEMORAL (N/A) INTRAOPERATIVE TRANSTHORACIC ECHOCARDIOGRAM (Left) as a surgical intervention.  The patient's history has been reviewed, patient examined, no change in status, stable for surgery.  I have reviewed the patient's chart and labs.  Questions were answered to the patient's satisfaction.     Gaye Pollack

## 2020-06-27 NOTE — Transfer of Care (Signed)
Immediate Anesthesia Transfer of Care Note  Patient: Dakota Meyer  Procedure(s) Performed: TRANSCATHETER AORTIC VALVE REPLACEMENT, TRANSFEMORAL (N/A Chest) INTRAOPERATIVE TRANSTHORACIC ECHOCARDIOGRAM (Left Chest) ULTRASOUND GUIDANCE FOR VASCULAR ACCESS (Bilateral Groin)  Patient Location: Cath Lab  Anesthesia Type:MAC  Level of Consciousness: awake, alert  and oriented  Airway & Oxygen Therapy: Patient Spontanous Breathing and Patient connected to nasal cannula oxygen  Post-op Assessment: Report given to RN and Post -op Vital signs reviewed and stable  Post vital signs: Reviewed and stable  Last Vitals:  Vitals Value Taken Time  BP 116/57 06/27/20 1432  Temp    Pulse 58 06/27/20 1433  Resp 15 06/27/20 1433  SpO2 97 % 06/27/20 1433  Vitals shown include unvalidated device data.  Last Pain:  Vitals:   06/27/20 0907  TempSrc:   PainSc: 5          Complications: No complications documented.

## 2020-06-27 NOTE — Progress Notes (Signed)
  [place] VALVE TEAM  Patient doing well s/p TAVR. He is hemodynamically stable but requiring low dose of pressors. Treated with 500 cc IVFs. Groin sites stable. ECG with new LBBB but no high grade block. Plan to dc arterial line and transfer to 4E. Plan for early ambulation after bedrest completed and hopeful discharge over the next 24-48 hours.   Angelena Form PA-C  [facility]  Pager [phone]

## 2020-06-27 NOTE — Op Note (Signed)
[facility]   MULTIDISCIPLINARY HEART VALVE TEAM   TAVR OPERATIVE NOTE   Date of Procedure:  06/27/2020  Preoperative Diagnosis: Severe Aortic Stenosis   Postoperative Diagnosis: Same   Procedure:    Transcatheter Aortic Valve Replacement - Percutaneous  Transfemoral Approach  Edwards Sapien 3 THV (size 29 mm, model # 9600TFX, serial # [id number])   Co-Surgeons:  Dakota Pollack, MD and Dakota Mocha, MD  Anesthesiologist:  Dakota Gaudy, MD  Echocardiographer:  Dakota Rouge, MD  Pre-operative Echo Findings:  Severe aortic stenosis  Normal left ventricular systolic function  Post-operative Echo Findings:  Trivial paravalvular leak  unchanged left ventricular systolic function  BRIEF CLINICAL NOTE AND INDICATIONS FOR SURGERY  Morbidly obese 65 yo male with severe, stage D1 aortic stenosis, presents for TAVR.  During the course of the patient's preoperative work up they have been evaluated comprehensively by a multidisciplinary team of specialists coordinated through the [facility] in the [facility].  They have been demonstrated to suffer from symptomatic severe aortic stenosis as noted above. The patient has been counseled extensively as to the relative risks and benefits of all options for the treatment of severe aortic stenosis including long term medical therapy, conventional surgery for aortic valve replacement, and transcatheter aortic valve replacement.  The patient has been independently evaluated in formal cardiac surgical consultation by Dr Dakota Meyer, who deemed the patient appropriate for TAVR. Based upon review of all of the patient's preoperative diagnostic tests they are felt to be candidate for transcatheter aortic valve replacement using the transfemoral approach as an alternative to conventional surgery.    Following the decision to proceed with transcatheter aortic valve replacement, a discussion  has been held regarding what types of management strategies would be attempted intraoperatively in the event of life-threatening complications, including whether or not the patient would be considered a candidate for the use of cardiopulmonary bypass and/or conversion to open sternotomy for attempted surgical intervention.  The patient has been advised of a variety of complications that might develop peculiar to this approach including but not limited to risks of death, stroke, paravalvular leak, aortic dissection or other major vascular complications, aortic annulus rupture, device embolization, cardiac rupture or perforation, acute myocardial infarction, arrhythmia, heart block or bradycardia requiring permanent pacemaker placement, congestive heart failure, respiratory failure, renal failure, pneumonia, infection, other late complications related to structural valve deterioration or migration, or other complications that might ultimately cause a temporary or permanent loss of functional independence or other long term morbidity.  The patient provides full informed consent for the procedure as described and all questions were answered preoperatively.  DETAILS OF THE OPERATIVE PROCEDURE  PREPARATION:   The patient is brought to the operating room on the above mentioned date and central monitoring was established by the anesthesia team including placement of a radial arterial line. The patient is placed in the supine position on the operating table.  Intravenous antibiotics are administered. The patient is monitored closely throughout the procedure under conscious sedation.  Baseline transthoracic echocardiogram is performed. The patient's chest, abdomen, both groins, and both lower extremities are prepared and draped in a sterile manner. A time out procedure is performed.   PERIPHERAL ACCESS:   Using ultrasound guidance, femoral arterial and venous access is obtained with placement of 6 Fr sheaths on the  left side.  A pigtail diagnostic catheter was passed through the femoral arterial sheath under fluoroscopic guidance into the aortic root.  A temporary transvenous pacemaker catheter was passed through the femoral venous sheath under fluoroscopic guidance into the right ventricle.  The pacemaker was tested to ensure stable lead placement and pacemaker capture. Aortic root angiography was performed in order to determine the optimal angiographic angle for valve deployment.  TRANSFEMORAL ACCESS:  A micropuncture technique is used to access the right femoral artery under fluoroscopic and ultrasound guidance.  2 Perclose devices are deployed at 10' and 2' positions to 'PreClose' the femoral artery. An 8 French sheath is placed and then an Amplatz Superstiff wire is advanced through the sheath. This is changed out for a 16 French transfemoral E-Sheath after progressively dilating over the Superstiff wire.  An AL-2 catheter was used to direct a straight-tip exchange length wire across the native aortic valve into the left ventricle. This was exchanged out for a pigtail catheter and position was confirmed in the LV apex. Simultaneous LV and Ao pressures were recorded.  The pigtail catheter was exchanged for an Amplatz Extra-stiff wire in the LV apex.    BALLOON AORTIC VALVULOPLASTY:  Not performed  TRANSCATHETER HEART VALVE DEPLOYMENT:  An Edwards Sapien 3 transcatheter heart valve (size 29 mm) was prepared and crimped per manufacturer's guidelines, and the proper orientation of the valve is confirmed on the [employer] delivery system. The valve was advanced through the introducer sheath using normal technique until in an appropriate position in the abdominal aorta beyond the sheath tip. The balloon was then retracted and using the fine-tuning wheel was centered on the valve. The valve was then advanced across the aortic arch using appropriate flexion of the catheter. The valve was carefully positioned  across the aortic valve annulus. The Commander catheter was retracted using normal technique. Once final position of the valve has been confirmed by angiographic assessment, the valve is deployed while temporarily holding ventilation and during rapid ventricular pacing to maintain systolic blood pressure < 50 mmHg and pulse pressure < 10 mmHg. The balloon inflation is held for >3 seconds after reaching full deployment volume. Once the balloon has fully deflated the balloon is retracted into the ascending aorta and valve function is assessed using echocardiography. The patient's hemodynamic recovery following valve deployment is good.  The deployment balloon and guidewire are both removed. Echo demostrated acceptable post-procedural gradients, stable mitral valve function, and trace aortic insufficiency.    PROCEDURE COMPLETION:  The sheath was removed and femoral artery closure is performed using the 2 previously deployed Perclose devices.  Protamine is administered once femoral arterial repair was complete. The site is clear with no evidence of bleeding or hematoma after the sutures are tightened. The temporary pacemaker and pigtail catheters are removed. Mynx closure is used for contralateral femoral arterial hemostasis for the 6 Fr sheath.  The patient tolerated the procedure well and is transported to the surgical intensive care in stable condition. There were no immediate intraoperative complications. All sponge instrument and needle counts are verified correct at completion of the operation.   The patient received a total of 60 mL of intravenous contrast during the procedure.   Dakota Mocha, MD 06/27/2020 6:01 PM

## 2020-06-27 NOTE — Interval H&P Note (Signed)
History and Physical Interval Note:  06/27/2020 10:40 AM  Dakota Meyer  has presented today for surgery, with the diagnosis of Severe Aortic Stenosis.  The various methods of treatment have been discussed with the patient and family. After consideration of risks, benefits and other options for treatment, the patient has consented to  Procedure(s): TRANSCATHETER AORTIC VALVE REPLACEMENT, TRANSFEMORAL (N/A) INTRAOPERATIVE TRANSTHORACIC ECHOCARDIOGRAM (Left) as a surgical intervention.  The patient's history has been reviewed, patient examined, no change in status, stable for surgery.  I have reviewed the patient's chart and labs.  Questions were answered to the patient's satisfaction.     Ellary Casamento K Leslieanne Cobarrubias   

## 2020-06-27 NOTE — Anesthesia Postprocedure Evaluation (Signed)
Anesthesia Post Note  Patient: [facility]  Procedure(s) Performed: TRANSCATHETER AORTIC VALVE REPLACEMENT, TRANSFEMORAL (N/A Chest) INTRAOPERATIVE TRANSTHORACIC ECHOCARDIOGRAM (Left Chest) ULTRASOUND GUIDANCE FOR VASCULAR ACCESS (Bilateral Groin)     Patient location during evaluation: Cath Lab Anesthesia Type: MAC Level of consciousness: awake and alert Pain management: pain level controlled Vital Signs Assessment: post-procedure vital signs reviewed and stable Respiratory status: spontaneous breathing, nonlabored ventilation, respiratory function stable and patient connected to nasal cannula oxygen Cardiovascular status: stable and blood pressure returned to baseline Postop Assessment: no apparent nausea or vomiting Anesthetic complications: no   No complications documented.  Last Vitals:  Vitals:   06/27/20 1434 06/27/20 1519  BP: (!) 132/51 (!) 106/42  Pulse: (!) 57 (!) 53  Resp: 15 16  Temp: 36.4 C 36.4 C  SpO2:      Last Pain:  Vitals:   06/27/20 1519  TempSrc: Temporal  PainSc: 0-No pain                 Jazmon Kos COKER

## 2020-06-27 NOTE — Anesthesia Procedure Notes (Signed)
Arterial Line Insertion Start/End5/04/2020 10:15 AM, 06/27/2020 10:35 AM Performed by: Leonor Liv, CRNA, CRNA  Patient location: Pre-op. Preanesthetic checklist: patient identified, IV checked, site marked, risks and benefits discussed, surgical consent, monitors and equipment checked, pre-op evaluation, timeout performed and anesthesia consent Lidocaine 1% used for infiltration Right, radial was placed Catheter size: 20 G Hand hygiene performed  and maximum sterile barriers used  Allen's test indicative of satisfactory collateral circulation Attempts: 2 Procedure performed without using ultrasound guided technique. Following insertion, Biopatch and dressing applied. Post procedure assessment: normal  Patient tolerated the procedure well with no immediate complications.

## 2020-06-27 NOTE — Progress Notes (Signed)
Mobility Specialist: Progress Note   06/27/20 1906  Mobility  Activity Ambulated to bathroom  Level of Assistance Minimal assist, patient does 75% or more  Assistive Device Front wheel walker  Distance Ambulated (ft) 30 ft  Mobility Response Tolerated well  Mobility performed by Mobility specialist  $Mobility charge 1 Mobility   Pre-Mobility: 69 HR Post-Mobility: 111 HR, 135/66 BP, 95% SpO2  Pt was able to ambulate to BR with minA to stand. Distance limited due to bilateral knee pain, RN notified. Pt back to bed after BR with call bell and phone at his side.   [facility] Keyanni Whittinghill Mobility Specialist Mobility Specialist Phone: [phone]

## 2020-06-27 NOTE — Anesthesia Procedure Notes (Signed)
Procedure Name: MAC Date/Time: 06/27/2020 12:30 PM Performed by: Griffin Dakin, CRNA Pre-anesthesia Checklist: Patient identified, Emergency Drugs available, Suction available, Patient being monitored and Timeout performed Patient Re-evaluated:Patient Re-evaluated prior to induction Oxygen Delivery Method: Simple face mask Induction Type: IV induction Placement Confirmation: positive ETCO2 and breath sounds checked- equal and bilateral Dental Injury: Teeth and Oropharynx as per pre-operative assessment

## 2020-06-27 NOTE — Progress Notes (Signed)
Patient's BP was 91/39 upon the arrival to Cath Lab recovery, and he was placed back by CRNA on Levophed at 4 mcg/min.  Patient was also give a 250 ml bolus of NSS.

## 2020-06-27 NOTE — Progress Notes (Signed)
[facility]   MULTIDISCIPLINARY HEART VALVE TEAM  Date:  06/27/2020   ID:  Dakota Meyer, DOB February 15, 1956, MRN [id number]  PCP:  Candi Leash, PA-C   No chief complaint on file.    HISTORY OF PRESENT ILLNESS: Dakota Meyer these a 65 y.o. male who presents for TAVR after undergoing multidisciplinary team with patient.  Comorbid conditions include obesity, remote history of acute promyelocytic leukemia, admitted now with progressive aortic stenosis and worsening LV function.  The patient has developed progressive symptoms of fatigue and shortness of breath.  He has advanced degenerative arthritis of both knees.  He will ultimately require bilateral knee replacement.  Preoperative evaluation has included a 2D echocardiogram, right and left heart catheterization, CT angiography studies.  He is found to have an LVEF of 30 to 35%, angiographically normal coronary arteries, essentially normal right heart pressures with peak mean transaortic gradient of 37 mmHg.  CT angiography studies demonstrate suitable transfemoral access for TAVR with a 29 mm Edwards SAPIEN 3 valve.    Past Medical History:  Diagnosis Date  . Anxiety   . Chronic knee pain   . Depression   . Hypertension   . Leukemia ([place]) 1997  . Morbid obesity ([place])   . Osteoarthritis 1998  . Sleep apnea   . Tinnitus aurium   . Venous stasis     Current Facility-Administered Medications  Medication Dose Route Frequency Provider Last Rate Last Admin  . 0.9 %  sodium chloride infusion   Intravenous Continuous Roberts Gaudy, MD 10 mL/hr at 06/27/20 0918 New Bag at 06/27/20 0918  . heparin 30,000 units/NS 1000 mL solution for CELLSAVER   Other To OR Skeet Simmer, RPH      . lactated ringers infusion   Intravenous Continuous Roberts Gaudy, MD 10 mL/hr at 06/27/20 0917 New Bag at 06/27/20 0917  . magnesium sulfate (IV Push/IM) injection 40 mEq  40 mEq Other To OR Skeet Simmer, [facility]      . potassium chloride  injection 80 mEq  80 mEq Other To OR Skeet Simmer, [facility]       Facility-Administered Medications Ordered in Other Encounters  Medication Dose Route Frequency Provider Last Rate Last Admin  . fentaNYL (SUBLIMAZE) injection   Intravenous Anesthesia Intra-op Viann Fish B, CRNA   50 mcg at 06/27/20 1215  . midazolam (VERSED) 5 MG/5ML injection   Intravenous Anesthesia Intra-op Viann Fish B, CRNA   2 mg at 06/27/20 1210  . propofol (DIPRIVAN) 500 MG/50ML infusion   Intravenous Continuous PRN Griffin Dakin, CRNA 32.886 mL/hr at 06/27/20 1244 30 mcg/kg/min at 06/27/20 1244    ALLERGIES:   Patient has no known allergies.   SOCIAL HISTORY:  The patient  reports that he has never smoked. He has never used smokeless tobacco. He reports current alcohol use. He reports that he does not use drugs.   FAMILY HISTORY:  The patient's family history includes Aneurysm in his mother; Cancer in his father and mother.   REVIEW OF SYSTEMS:  Positive for fatigue, dyspnea, orthopnea, urinary frequency, headaches, gait instability.   All other systems are reviewed and negative.   PHYSICAL EXAM: VS:  BP (!) 150/101   Pulse (!) 103   Temp 98.2 F (36.8 C) (Oral)   Resp 20   Ht 6\' 1"  (1.854 m)   Wt (!) 182.7 kg   SpO2 96%   BMI 53.14 kg/m  , BMI Body mass index is 53.14 kg/m. GEN:  Morbidly obese gentleman in no distress HEENT: normal Neck: No JVD. carotids 2+ without bruits or masses Cardiac: The heart is RRR with grade 3/6 harsh crescendo decrescendo murmur at the right sternal border no edema. Pedal pulses 2+ = bilaterally  Respiratory:  clear to auscultation bilaterally GI: soft, nontender, nondistended, + BS MS: no deformity or atrophy Skin: warm and dry, no rash Neuro:  Strength and sensation are intact Psych: euthymic mood, full affect   RECENT LABS: 04/10/2020: BNP 99.7; TSH 1.870 05/19/2020: Magnesium 1.9 06/23/2020: ALT 11; BUN 25; Creatinine, Ser 1.29; Hemoglobin 12.8; Platelets 227;  Potassium 3.9; Sodium 138  04/10/2020: Chol/HDL Ratio 4.0; Cholesterol, Total 170; HDL 43; LDL Chol Calc (NIH) 96; Triglycerides 182   Estimated Creatinine Clearance: 96.4 mL/min (A) (by C-G formula based on SCr of 1.29 mg/dL (H)).   Wt Readings from Last 3 Encounters:  06/27/20 (!) 182.7 kg  06/23/20 (!) 182.7 kg  06/19/20 (!) 179.2 kg      ASSESSMENT AND PLAN: 1.  Severe, Stage D1, aortic stenosis: 65 year old gentleman with New York Heart Association functional class III symptoms of exertional fatigue and dyspnea started to suffer from severe aortic stenosis.  He has developed progressive LV dysfunction with LVEF 35%, found to have normal coronary arteries by coronary angiography, he has findings consistent with severe low-flow low gradient aortic stenosis.  The patient has multiple severe comorbid conditions including obesity hypoventilation syndrome or functional capacity.  He is best suited for treatment of his transcatheter aortic valve replacement (conventional surgical aortic valve replacement with high risk comorbidities.  He has undergone multidisciplinary heart team evaluation and we all agree that he is appropriate for TAVR with an [place] 3 transcatheter heart valve via transfemoral access.    Deatra James 06/27/2020 12:46 PM     [address] [place] [address]  [phone] (office) [phone] (fax)

## 2020-06-27 NOTE — Op Note (Signed)
[facility]   MULTIDISCIPLINARY HEART VALVE TEAM   TAVR OPERATIVE NOTE   Date of Procedure:  06/27/2020  Preoperative Diagnosis: Severe Aortic Stenosis   Postoperative Diagnosis: Same   Procedure:    Transcatheter Aortic Valve Replacement - Percutaneous Right Transfemoral Approach  Edwards Sapien 3 THV (size 29 mm, model # 9600TFX, serial # [id number])   Co-Surgeons:  Gaye Pollack, MD and Sherren Mocha, MD   Anesthesiologist:  Orland Jarred, MD  Echocardiographer:  P. Johnsie Cancel, MD  Pre-operative Echo Findings:  Severe aortic stenosis  Moderate left ventricular systolic dysfunction  Post-operative Echo Findings:  Trivial paravalvular leak  Unchanged Moderate left ventricular systolic dysfunction   BRIEF CLINICAL NOTE AND INDICATIONS FOR SURGERY  The patient is a 65 year old gentleman who presents with stage D, severe, symptomatic aortic stenosis with New York Heart Association class III symptoms of exertional fatigue and shortness of breath as well as dizziness, lower extremity edema and orthopnea. I have personally reviewed his 2D echocardiogram, cardiac catheterization, and CTA studies. 2D echocardiogram shows an ejection fraction of 30 to 35% which is slightly decreased from September 2021. Cardiac catheterization shows normal coronary arteries.It appears that his chronic combined systolic and diastolic congestive heart failure arerelated to valvular heart disease. He may also have obesity hypoventilation syndrome with a PCO2 of 52 on his room air arterial blood gas. I think the best treatment for him is aortic valve replacement for relief of his symptoms and to prevent progressive left ventricular deterioration. Given his morbid obesity and comorbid risk factors I think the best option would be to perform transcatheter aortic valve replacement. His gated cardiac CTA shows anatomy suitable for TAVR using a SAPIEN 3 valve. His abdominal and pelvic CTA  appears to show adequate pelvic vascular anatomy to allow transfemoral insertion.  The patient was counseled at length regarding treatment alternatives for management of severe symptomatic aortic stenosis. The risks and benefits of surgical intervention has been discussed in detail.I think he would have a very slow and difficult recovery following open aortic valve replacement. Long-term prognosis with medical therapy was discussed. Alternative approaches such as conventional surgical aortic valve replacement, transcatheter aortic valve replacement, and palliative medical therapy were compared and contrasted at length. This discussion was placed in the context of the patient's own specific clinical presentation and past medical history. All ofhisquestions have been addressed.   Following the decision to proceed with transcatheter aortic valve replacement, a discussion was held regarding what types of management strategies would be attempted intraoperatively in the event of life-threatening complications, including whether or not the patient would be considered a candidate for the use of cardiopulmonary bypass and/or conversion to open sternotomy for attempted surgical intervention.I think he would be a candidate for emergent sternotomy if needed to manage any intraoperative complications although his operative risk would certainly be significantly increased due to his morbid obesity. The patient is aware of the fact that transient use of cardiopulmonary bypass may be necessary. The patient has been advised of a variety of complications that might develop including but not limited to risks of death, stroke, paravalvular leak, aortic dissection or other major vascular complications, aortic annulus rupture, device embolization, cardiac rupture or perforation, mitral regurgitation, acute myocardial infarction, arrhythmia, heart block or bradycardia requiring permanent pacemaker placement, congestive heart  failure, respiratory failure, renal failure, pneumonia, infection, other late complications related to structural valve deterioration or migration, or other complications that might ultimately cause a temporary or permanent loss of functional independence  or other long term morbidity. The patient provides full informed consent for the procedure as described and all questions were answered.    DETAILS OF THE OPERATIVE PROCEDURE  PREPARATION:    The patient was brought to the operating room on the above mentioned date and appropriate monitoring was established by the anesthesia team. The patient was placed in the supine position on the operating table.  Intravenous antibiotics were administered. The patient was monitored closely throughout the procedure under conscious sedation.  Baseline transthoracic echocardiogram was performed. The patient's abdomen and both groins were prepped and draped in a sterile manner. A time out procedure was performed.   PERIPHERAL ACCESS:    Using the modified Seldinger technique, femoral arterial and venous access was obtained with placement of 6 Fr sheaths on the left side.  A pigtail diagnostic catheter was passed through the left arterial sheath under fluoroscopic guidance into the aortic root.  A temporary transvenous pacemaker catheter was passed through the left femoral venous sheath under fluoroscopic guidance into the right ventricle.  The pacemaker was tested to ensure stable lead placement and pacemaker capture. Aortic root angiography was performed in order to determine the optimal angiographic angle for valve deployment.   TRANSFEMORAL ACCESS:   Percutaneous transfemoral access and sheath placement was performed using ultrasound guidance.  The right common femoral artery was cannulated using a micropuncture needle and appropriate location was verified using hand injection angiogram.  A pair of Abbott Perclose percutaneous closure devices were placed and  a 6 French sheath replaced into the femoral artery.  The patient was heparinized systemically and ACT verified > 250 seconds.    A 16 Fr transfemoral E-sheath was introduced into the right common femoral artery after progressively dilating over an Amplatz superstiff wire. An AL-2 catheter was used to direct a straight-tip exchange length wire across the native aortic valve into the left ventricle. This was exchanged out for a pigtail catheter and position was confirmed in the LV apex. The pigtail catheter was exchanged for an Amplatz Extra-stiff wire in the LV apex.   BALLOON AORTIC VALVULOPLASTY:   Not performed   TRANSCATHETER HEART VALVE DEPLOYMENT:   An Edwards Sapien 3 Ultra transcatheter heart valve (size 29 mm) was prepared and crimped per manufacturer's guidelines, and the proper orientation of the valve is confirmed on the [employer] delivery system. The valve was advanced through the introducer sheath using normal technique until in an appropriate position in the abdominal aorta beyond the sheath tip. The balloon was then retracted and using the fine-tuning wheel was centered on the valve. The valve was then advanced across the aortic arch using appropriate flexion of the catheter. The valve was carefully positioned across the aortic valve annulus. The Commander catheter was retracted using normal technique. Once final position of the valve has been confirmed by angiographic assessment, the valve is deployed while temporarily holding ventilation and during rapid ventricular pacing to maintain systolic blood pressure < 50 mmHg and pulse pressure < 10 mmHg. The balloon inflation is held for >3 seconds after reaching full deployment volume. Once the balloon has fully deflated the balloon is retracted into the ascending aorta and valve function is assessed using echocardiography. There is felt to be trivial paravalvular leak and no central aortic insufficiency.  The patient's hemodynamic  recovery following valve deployment is good.  The deployment balloon and guidewire are both removed.    PROCEDURE COMPLETION:   The sheath was removed and femoral artery closure performed.  Protamine was administered once femoral arterial repair was complete. The temporary pacemaker, pigtail catheters and femoral sheaths were removed with manual pressure used for hemostasis.  A Mynx femoral closure device was utilized following removal of the diagnostic sheath in the left femoral artery.  The patient tolerated the procedure well and is transported to the cath lab recovery area in stable condition. There were no immediate intraoperative complications. All sponge instrument and needle counts are verified correct at completion of the operation.   No blood products were administered during the operation.  The patient received a total of 40 mL of intravenous contrast during the procedure.   Gaye Pollack, MD 06/27/2020 4:49 PM

## 2020-06-27 NOTE — Anesthesia Preprocedure Evaluation (Signed)
Anesthesia Evaluation  Patient identified by MRN, date of birth, ID band Patient awake    Reviewed: Allergy & Precautions, NPO status , Patient's Chart, lab work & pertinent test results  Airway Mallampati: III  TM Distance: >3 FB Neck ROM: Full    Dental  (+) Teeth Intact,    Pulmonary    breath sounds clear to auscultation       Cardiovascular hypertension,  Rhythm:Regular Rate:Normal     Neuro/Psych    GI/Hepatic   Endo/Other    Renal/GU      Musculoskeletal   Abdominal (+) + obese,   Peds  Hematology   Anesthesia Other Findings   Reproductive/Obstetrics                             Anesthesia Physical Anesthesia Plan  ASA: III  Anesthesia Plan: MAC   Post-op Pain Management:    Induction: Intravenous  PONV Risk Score and Plan: Ondansetron and Dexamethasone  Airway Management Planned: Natural Airway and Simple Face Mask  Additional Equipment: Arterial line  Intra-op Plan:   Post-operative Plan:   Informed Consent: I have reviewed the patients History and Physical, chart, labs and discussed the procedure including the risks, benefits and alternatives for the proposed anesthesia with the patient or authorized representative who has indicated his/her understanding and acceptance.     Dental advisory given  Plan Discussed with: CRNA and Anesthesiologist  Anesthesia Plan Comments:         Anesthesia Quick Evaluation

## 2020-06-27 NOTE — Progress Notes (Signed)
  Echocardiogram 2D Echocardiogram has been performed.  Dakota Meyer 06/27/2020, 1:58 PM

## 2020-06-28 ENCOUNTER — Encounter (HOSPITAL_BASED_OUTPATIENT_CLINIC_OR_DEPARTMENT_OTHER): Payer: Medicare Other | Admitting: Cardiovascular Disease

## 2020-06-28 ENCOUNTER — Inpatient Hospital Stay (HOSPITAL_COMMUNITY): Payer: Medicare Other

## 2020-06-28 ENCOUNTER — Encounter (HOSPITAL_COMMUNITY): Payer: Self-pay | Admitting: Cardiovascular Disease

## 2020-06-28 DIAGNOSIS — C9591 Leukemia, unspecified, in remission: Secondary | ICD-10-CM | POA: Diagnosis not present

## 2020-06-28 DIAGNOSIS — Z006 Encounter for examination for normal comparison and control in clinical research program: Secondary | ICD-10-CM | POA: Diagnosis not present

## 2020-06-28 DIAGNOSIS — Z952 Presence of prosthetic heart valve: Secondary | ICD-10-CM | POA: Diagnosis not present

## 2020-06-28 DIAGNOSIS — I5042 Chronic combined systolic (congestive) and diastolic (congestive) heart failure: Secondary | ICD-10-CM | POA: Diagnosis not present

## 2020-06-28 DIAGNOSIS — I350 Nonrheumatic aortic (valve) stenosis: Secondary | ICD-10-CM | POA: Diagnosis not present

## 2020-06-28 LAB — ECHOCARDIOGRAM COMPLETE
AR max vel: 1.29 cm2
AV Area VTI: 1.16 cm2
AV Area mean vel: 1.18 cm2
AV Mean grad: 20 mmHg
AV Peak grad: 35.3 mmHg
Ao pk vel: 2.97 m/s
Area-P 1/2: 4.49 cm2
Height: 73 in
S' Lateral: 4 cm
Weight: 6444.49 oz

## 2020-06-28 LAB — BASIC METABOLIC PANEL
Anion gap: 5 (ref 5–15)
BUN: 19 mg/dL (ref 8–23)
CO2: 31 mmol/L (ref 22–32)
Calcium: 8.7 mg/dL — ABNORMAL LOW (ref 8.9–10.3)
Chloride: 102 mmol/L (ref 98–111)
Creatinine, Ser: 1.21 mg/dL (ref 0.61–1.24)
GFR, Estimated: >60 mL/min (ref >60)
Glucose, Bld: 97 mg/dL (ref 70–99)
Potassium: 3.8 mmol/L (ref 3.5–5.1)
Sodium: 138 mmol/L (ref 135–145)

## 2020-06-28 LAB — CBC
HCT: 33.6 % — ABNORMAL LOW (ref 39.0–52.0)
Hemoglobin: 11 g/dL — ABNORMAL LOW (ref 13.0–17.0)
MCH: 31.7 pg (ref 26.0–34.0)
MCHC: 32.7 g/dL (ref 30.0–36.0)
MCV: 96.8 fL (ref 80.0–100.0)
Platelets: 175 10*3/uL (ref 150–400)
RBC: 3.47 MIL/uL — ABNORMAL LOW (ref 4.22–5.81)
RDW: 13 % (ref 11.5–15.5)
WBC: 6.5 10*3/uL (ref 4.0–10.5)
nRBC: 0 % (ref 0.0–0.2)

## 2020-06-28 LAB — GLUCOSE, CAPILLARY
Glucose-Capillary: 104 mg/dL — ABNORMAL HIGH (ref 70–99)
Glucose-Capillary: 145 mg/dL — ABNORMAL HIGH (ref 70–99)

## 2020-06-28 LAB — MAGNESIUM: Magnesium: 1.8 mg/dL (ref 1.7–2.4)

## 2020-06-28 MED ORDER — CLOPIDOGREL BISULFATE 75 MG PO TABS: 75.0000 mg | ORAL_TABLET | Freq: Every day | ORAL | 1 refills | Status: DC

## 2020-06-28 MED ORDER — PERFLUTREN LIPID MICROSPHERE
1.0000 mL | INTRAVENOUS | Status: AC | PRN
  Administered 2020-06-28: 4 mL via INTRAVENOUS
  Filled 2020-06-28: qty 10

## 2020-06-28 MED FILL — Magnesium Sulfate Inj 50%: INTRAMUSCULAR | Qty: 10 | Status: AC

## 2020-06-28 MED FILL — Potassium Chloride Inj 2 mEq/ML: INTRAVENOUS | Qty: 40 | Status: AC

## 2020-06-28 MED FILL — Heparin Sodium (Porcine) Inj 1000 Unit/ML: INTRAMUSCULAR | Qty: 30 | Status: AC

## 2020-06-28 NOTE — Progress Notes (Signed)
[id number] Offered to walk with pt. He stated needed to save energy for home. He has bad knees and walks only short distances. Has walked to bathroom today per RN. Gave pt heart healthy diet and encouraged him to watch sodium. Offered CRP 2 but pt stated he could not do with orthopedic issues. Pt hopeful to go home today. Graylon Good RN BSN 06/28/2020 11:20 AM

## 2020-06-28 NOTE — Discharge Summary (Addendum)
[facility] VALVE TEAM  Discharge Summary    Patient ID: Dakota Meyer MRN: [id number]; DOB: [birth year]  Admit date: 06/27/2020 Discharge date: 06/28/2020  Primary Care Provider: Candi Leash, PA-C  Primary Cardiologist: Donato Heinz, MD / Dr. Burt Knack & Dr. Cyndia Bent (TAVR)  Discharge Diagnoses    Principal Problem:   S/P TAVR (transcatheter aortic valve replacement) Active Problems:   History of leukemia   Hypertension   HFrEF (heart failure with reduced ejection fraction) (HCC)   Severe aortic stenosis   Morbid obesity ([place])   Chronic knee pain   Depression   Leukemia ([place])   Allergies No Known Allergies  Diagnostic Studies/Procedures    TAVR OPERATIVE NOTE   Date of Procedure:                06/27/2020  Preoperative Diagnosis:      Severe Aortic Stenosis   Postoperative Diagnosis:    Same   Procedure:        Transcatheter Aortic Valve Replacement - Percutaneous  Transfemoral Approach             Edwards Sapien 3 THV (size 29 mm, model # 9600TFX, serial # [id number])              Co-Surgeons:                        Gaye Pollack, MD and Sherren Mocha, MD  Anesthesiologist:                  Roberts Gaudy, MD  Echocardiographer:              Jenkins Rouge, MD  Pre-operative Echo Findings: ? Severe aortic stenosis ? Normal left ventricular systolic function  Post-operative Echo Findings: ? Trivial paravalvular leak ? unchanged left ventricular systolic function  _____________   Echo 06/28/20:  IMPRESSIONS 1. Diffuse hyokinesis worse in the inferior wall abnormal septal motion.  Left ventricular ejection fraction, by estimation, is 35 to 40%. The left  ventricle has moderately decreased function. The left ventricle has no  regional wall motion abnormalities.  The left ventricular internal cavity size was moderately dilated. Left  ventricular diastolic parameters were normal.  2. Right  ventricular systolic function is normal. The right ventricular  size is normal.  3. The mitral valve is normal in structure. No evidence of mitral valve  regurgitation. No evidence of mitral stenosis.  4. Post TAVR with 29 mm Sapien 3 valve no significant PVL mean gradient  20 mmHg peak 35 mmHg DVI 0.37 and AVR 1.3 cm2 Gradients have increased  since implant 06/27/20. The aortic valve has been repaired/replaced. Aortic  valve regurgitation is not visualized. No aortic stenosis is present. Procedure Date: 06/27/2020.  5. The inferior vena cava is normal in size with greater than 50%  respiratory variability, suggesting right atrial pressure of 3 mmHg.    History of Present Illness     Dakota Meyer is a 65 y.o. 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  Pt has developed progressive dyspnea and fatigue. He has severe degenerative arthritis of both knees and needs knee replacements which decreases his mobility and increases exertion with walking. He also reported dizziness with standing. Echocardiogram in September 2021 showed  moderate calcification of the aortic valve with a mean gradient of 26 mmHg and a peak gradient 43 mmHg.  Dimensionless index was 0.29.  Left ventricular ejection fraction was 35 to 40% with global hypokinesis and grade 1 diastolic dysfunction.  His most recent echocardiogram on 05/16/2020 showed an increase in the mean gradient to 36 mmHg with a peak gradient of 61.5 mmHg.  Dimensionless index of 0.33.  Left ventricular ejection fraction is 30 to 35% with grade 1 diastolic dysfunction.  He subsequently underwent cardiac catheterization on 05/25/2020 which showed angiographically normal coronary arteries.  Right heart pressures were normal with high cardiac output.  The peak to peak gradient  across aortic valve was 52 mmHg with a mean gradient of 37 mmHg.  Aortic valve area was measured at 1.7 cm.  The patient has been evaluated by the multidisciplinary valve team and felt to have severe, symptomatic aortic stenosis and to be a suitable candidate for TAVR, which was set up for 06/27/20.   Hospital Course     Consultants: none  Severe AS: s/p successful TAVR with a 29 mm Edwards Sapien 3 THV via the TF approach on 06/27/20. Post operative echo showed EF 35-40%, normally functioning TAVR with a mean gradient of 20 mmHg (likely elevated due to patient prosthesis mismatch due to body habitus) and no PVL. Groin sites are stable. ECG with stable IVCD and boderline 1st deg AV block. There is no evidence of HAVB by tele or serial ECGs. Overall, conduciton disease is largely unchanged from previous ECG tracings. Continued on Asprin and started on Plavix 75 mg daily. Plan for discharge home today with close follow up in the office next week.  HTN: BP well controlled.. Resume all home meds excepted HTCZ held given symptoms of orthostasis.  Chronic combined S/D CHF: EF 35-40% on echo today. Continue GDMT with Entresto, Coreg, Jardiance, eplerenone. Pt has noted a lot of orthostatic symptoms. He said this morning was the first time he has been able to stand up without dizziness. I think this likely has more to do with [place] holding his home CHF meds than anything. Will plan to stop HCTZ and follow symptoms and BP.   SVT: noted to have a brief run of SVT on tele. Resume BB.   Morbid obesity: Body mass index is 53.14 kg/m.  _____________  Discharge Vitals Blood pressure 107/68, pulse 99, temperature 98.4 F (36.9 C), temperature source Oral, resp. rate 14, height 6\' 1"  (1.854 m), weight (!) 182.7 kg, SpO2 98 %.  Filed Weights   06/27/20 0825  Weight: (!) 182.7 kg    GEN: Well nourished, well developed, in no acute distress, morbidly obese HEENT: normal Neck: no JVD or masses Cardiac: RRR; no  murmurs, rubs, or gallops,no edema. Heart sounds distant Respiratory:  clear to auscultation bilaterally, normal work of breathing GI: soft, nontender, nondistended, + BS MS: no deformity or atrophy Skin: warm and dry, no rash.  Groin sites clear without hematoma or ecchymosis  Neuro:  Alert and Oriented x 3, Strength and sensation are intact Psych: euthymic mood, full affect   Labs & Radiologic Studies    CBC Recent Labs    06/27/20 1454 06/28/20 0140  WBC  --  6.5  HGB 11.9* 11.0*  HCT 35.0* 33.6*  MCV  --  96.8  PLT  --  0000000   Basic Metabolic Panel Recent Labs    06/27/20 1454 06/28/20 0140  NA 140 138  K 3.9 3.8  CL 101  102  CO2  --  31  GLUCOSE 120* 97  BUN 17 19  CREATININE 1.10 1.21  CALCIUM  --  8.7*  MG  --  1.8   Liver Function Tests No results for input(s): AST, ALT, ALKPHOS, BILITOT, PROT, ALBUMIN in the last 72 hours. No results for input(s): LIPASE, AMYLASE in the last 72 hours. Cardiac Enzymes No results for input(s): CKTOTAL, CKMB, CKMBINDEX, TROPONINI in the last 72 hours. BNP Invalid input(s): POCBNP D-Dimer No results for input(s): DDIMER in the last 72 hours. Hemoglobin A1C No results for input(s): HGBA1C in the last 72 hours. Fasting Lipid Panel No results for input(s): CHOL, HDL, LDLCALC, TRIG, CHOLHDL, LDLDIRECT in the last 72 hours. Thyroid Function Tests No results for input(s): TSH, T4TOTAL, T3FREE, THYROIDAB in the last 72 hours.  Invalid input(s): FREET3 _____________  DG Chest 2 View  Result Date: 06/23/2020 CLINICAL DATA:  Preoperative study for TAVR. EXAM: CHEST - 2 VIEW COMPARISON:  CT a chest dated June 05, 2020. FINDINGS: The heart size and mediastinal contours are within normal limits. Both lungs are clear. The visualized skeletal structures are unremarkable. IMPRESSION: No active cardiopulmonary disease. Electronically Signed   By: Titus Dubin M.D.   On: 06/23/2020 11:53   CT CORONARY MORPH W/CTA COR W/SCORE W/CA W/CM  &/OR WO/CM  Addendum Date: 06/07/2020   ADDENDUM REPORT: 06/07/2020 23:44 CLINICAL DATA:  30 -year-old male with severe aortic stenosis being evaluated for a TAVR procedure. EXAM: Cardiac TAVR CT TECHNIQUE: The patient was scanned on a [employer]. A 120 kV retrospective scan was triggered in the descending thoracic aorta at 111 HU's. Gantry rotation speed was 250 msecs and collimation was .6 mm. No beta blockade or nitro were given. The 3D data set was reconstructed in 5% intervals of the R-R cycle. Systolic and diastolic phases were analyzed on a dedicated work station using MPR, MIP and VRT modes. The patient received 80 cc of contrast. FINDINGS: Aortic Root: Aortic valve: Trileaflet Aortic valve calcium score: 3409 Aortic annulus: Diameter: 29mm x 91mm Perimeter: 15mm Area: 664 mm^2 Calcifications: Mild calcification adjacent to [place] Coronary height: Min Left - 58mm, Max Left - 67mm; Min Right - 68mm Sinotubular height: Left cusp - 90mm; Right cusp - 79mm; Noncoronary cusp - 55mm LVOT (as measured 3 mm below the annulus): Diameter: 44mm x 12mm Area: 734 mm^2 Calcifications: No calcifications Aortic sinus width: Left cusp - 67mm; Right cusp - 39mm; Noncoronary cusp - 42mm Sinotubular junction width: 51mm x 22mm Optimum Fluoroscopic Angle for Delivery: RAO 30 CAU 8 Cardiac: Right atrium: Normal size Right ventricle: Normal size Pulmonary arteries: Normal size Pulmonary veins: Normal configuration Left atrium: Moderate enlargement Left ventricle: Moderately dilated Pericardium: Normal thickness Coronary arteries: Calcium score 195 (64th percentile) IMPRESSION: 1. Poor image quality, likely due to morbid obesity causing high image noise 2. Trileaflet aortic valve with severe calcifications (AV calcium score 3409) 3. Aortic annulus measures 60mm x 4mm in diameter with perimeter 59mm and area 664 mm^2. Mild annular calcifications adjacent to [place]. Annular measurements suitable for delivery of 27mm Edwards  Sapien 3 valve 4. Sufficient coronary to annulus distance, measuring 64mm to left main and 8mm to RCA 5.  Optimum Fluoroscopic Angle for Delivery: RAO 30 CAU 8 6.  Coronary calcium score 195 (64th percentile) Electronically Signed   By: Oswaldo Milian MD   On: 06/07/2020 23:44   Result Date: 06/07/2020 EXAM: OVER-READ INTERPRETATION  CT CHEST The following report is an over-read performed  by radiologist Dr. Vinnie Langton of [facility], [place] on 06/05/2020. This over-read does not include interpretation of cardiac or coronary anatomy or pathology. The coronary calcium score/coronary CTA interpretation by the cardiologist is attached. COMPARISON:  None. FINDINGS: Extracardiac findings will be described separately under dictation for contemporaneously obtained CTA chest, abdomen and pelvis. IMPRESSION: Please see separate dictation for contemporaneously obtained CTA chest, abdomen and pelvis dated 06/05/2020 for full description of relevant extracardiac findings. Electronically Signed: By: Vinnie Langton M.D. On: 06/05/2020 11:56   CT ANGIO CHEST AORTA W/CM & OR WO/CM  Result Date: 06/06/2020 CLINICAL DATA:  65 year old male with history of severe aortic stenosis. Preprocedural study prior to potential transcatheter aortic valve replacement (TAVR) procedure. EXAM: CT ANGIOGRAPHY CHEST, ABDOMEN AND PELVIS TECHNIQUE: Non-contrast CT of the chest was initially obtained. Multidetector CT imaging through the chest, abdomen and pelvis was performed using the standard protocol during bolus administration of intravenous contrast. Multiplanar reconstructed images and MIPs were obtained and reviewed to evaluate the vascular anatomy. CONTRAST:  123mL OMNIPAQUE IOHEXOL 350 MG/ML SOLN COMPARISON:  No priors. FINDINGS: CTA CHEST FINDINGS Cardiovascular: Heart size is borderline enlarged with concentric left ventricular hypertrophy. There is no significant pericardial fluid, thickening or pericardial  calcification. There is aortic atherosclerosis, as well as atherosclerosis of the great vessels of the mediastinum and the coronary arteries, including calcified atherosclerotic plaque in the left anterior descending and left circumflex coronary arteries. Severe calcifications of the aortic valve. Mediastinum/Lymph Nodes: No pathologically enlarged mediastinal or hilar lymph nodes. Please note that accurate exclusion of hilar adenopathy is limited on noncontrast CT scans. Esophagus is unremarkable in appearance. No axillary lymphadenopathy. Lungs/Pleura: No acute consolidative airspace disease. No pleural effusions. No suspicious appearing pulmonary nodules or masses are noted. Areas of lucency interspersed with areas of ground-glass attenuation with intervening geographic margins, strongly suggestive of air trapping from small airways disease. Musculoskeletal/Soft Tissues: There are no aggressive appearing lytic or blastic lesions noted in the visualized portions of the skeleton. CTA ABDOMEN AND PELVIS FINDINGS Hepatobiliary: No suspicious cystic or solid hepatic lesions. No intra or extrahepatic biliary ductal dilatation. Mild diffuse low attenuation throughout the hepatic parenchyma, indicative of hepatic steatosis. Status post cholecystectomy. Pancreas: No pancreatic mass. No pancreatic ductal dilatation. No pancreatic or peripancreatic fluid collections or inflammatory changes. Spleen: Calcified granulomas in the spleen. Otherwise, unremarkable. Adrenals/Urinary Tract: Bilateral kidneys and adrenal glands are normal in appearance. No hydroureteronephrosis. Urinary bladder is normal in appearance. Stomach/Bowel: The appearance of the stomach is normal. No pathologic dilatation of small bowel or colon. Normal appendix. Vascular/Lymphatic: Aortic atherosclerosis, without evidence of aneurysm or dissection in the abdominal or pelvic vasculature. Vascular findings and measurements pertinent to potential TAVR  procedure, as detailed below. No lymphadenopathy noted in the abdomen or pelvis. Reproductive: Prostate gland and seminal vesicles are unremarkable in appearance. Other: No significant volume of ascites.  No pneumoperitoneum. Musculoskeletal: There are no aggressive appearing lytic or blastic lesions noted in the visualized portions of the skeleton. VASCULAR MEASUREMENTS PERTINENT TO TAVR: Comment: Today's study is of very poor technical quality secondary to suboptimal contrast bolus and the patient's large body habitus which creates excessive image noise. As a result, vascular findings and measurements below are best estimates given that image quality was considered nearly nondiagnostic. AORTA: Minimal Aortic Diameter-22 x 18 mm Severity of Aortic Calcification-mild RIGHT PELVIS: Right Common Iliac Artery - Minimal Diameter-12.7 x 13.4 mm Tortuosity-mild Calcification-mild Right External Iliac Artery - Minimal Diameter-8.0 x 6.8 mm Tortuosity-severe Calcification-none Right Common  Femoral Artery - Minimal Diameter-8.1 x 6.3 mm Tortuosity-mild Calcification-mild LEFT PELVIS: Left Common Iliac Artery - Minimal Diameter-14.1 x 13.3 mm Tortuosity-mild Calcification-none Left External Iliac Artery - Minimal Diameter-9.5 x 8.0 mm Tortuosity-severe Calcification-none Left Common Femoral Artery - Minimal Diameter-9.4 x 8.2 mm Tortuosity-mild Calcification-none Review of the MIP images confirms the above findings. IMPRESSION: 1. Vascular findings and measurements pertinent to potential TAVR procedure, as detailed above. 2. Severe thickening and calcification of the aortic valve, compatible with reported clinical history of severe aortic stenosis. 3. Concentric left ventricular hypertrophy. 4. Evidence of air trapping in the lungs, suggestive of small airways disease. 5. Hepatic steatosis. 6. Additional incidental findings, as above. Electronically Signed   By: Vinnie Langton M.D.   On: 06/06/2020 05:52   ECHOCARDIOGRAM  COMPLETE  Result Date: 06/28/2020    ECHOCARDIOGRAM REPORT   Patient Name:   Dakota Meyer Date of Exam: 06/28/2020 Medical Rec #:  [id number]       Height:       73.0 in Accession #:    [id number]      Weight:       402.8 lb Date of Birth:  02-01-1956        BSA:          2.897 m Patient Age:    5 years        BP:           107/68 mmHg Patient Gender: M               HR:           99 bpm. Exam Location:  Inpatient Procedure: 2D Echo, Cardiac Doppler and Color Doppler Indications:    Post TAVR  History:        Patient has prior history of Echocardiogram examinations, most                 recent 06/27/2020. Risk Factors:Hypertension.  Sonographer:    Cammy Brochure Referring Phys: [id number] [place]  1. Diffuse hyokinesis worse in the inferior wall abnormal septal motion. Left ventricular ejection fraction, by estimation, is 35 to 40%. The left ventricle has moderately decreased function. The left ventricle has no regional wall motion abnormalities.  The left ventricular internal cavity size was moderately dilated. Left ventricular diastolic parameters were normal.  2. Right ventricular systolic function is normal. The right ventricular size is normal.  3. The mitral valve is normal in structure. No evidence of mitral valve regurgitation. No evidence of mitral stenosis.  4. Post TAVR with 29 mm Sapien 3 valve no significant PVL mean gradient 20 mmHg peak 35 mmHg DVI 0.37 and AVR 1.3 cm2 Gradients have increased since implant 06/27/20. The aortic valve has been repaired/replaced. Aortic valve regurgitation is not visualized. No aortic stenosis is present. Procedure Date: 06/27/2020.  5. The inferior vena cava is normal in size with greater than 50% respiratory variability, suggesting right atrial pressure of 3 mmHg. FINDINGS  Left Ventricle: Diffuse hyokinesis worse in the inferior wall abnormal septal motion. Left ventricular ejection fraction, by estimation, is 35 to 40%. The left ventricle has  moderately decreased function. The left ventricle has no regional wall motion abnormalities. The left ventricular internal cavity size was moderately dilated. There is no left ventricular hypertrophy. Left ventricular diastolic parameters were normal. Right Ventricle: The right ventricular size is normal. No increase in right ventricular wall thickness. Right ventricular systolic function is normal. Left Atrium: Left atrial size was  normal in size. Right Atrium: Right atrial size was normal in size. Pericardium: There is no evidence of pericardial effusion. Mitral Valve: The mitral valve is normal in structure. No evidence of mitral valve regurgitation. No evidence of mitral valve stenosis. Tricuspid Valve: The tricuspid valve is normal in structure. Tricuspid valve regurgitation is trivial. No evidence of tricuspid stenosis. Aortic Valve: Post TAVR with 29 mm Sapien 3 valve no significant PVL mean gradient 20 mmHg peak 35 mmHg DVI 0.37 and AVR 1.3 cm2 Gradients have increased since implant 06/27/20. The aortic valve has been repaired/replaced. Aortic valve regurgitation is not  visualized. No aortic stenosis is present. Aortic valve mean gradient measures 20.0 mmHg. Aortic valve peak gradient measures 35.3 mmHg. Aortic valve area, by VTI measures 1.16 cm. There is a 29 mm Sapien prosthetic, stented (TAVR) valve present in the  aortic position. Pulmonic Valve: The pulmonic valve was normal in structure. Pulmonic valve regurgitation is not visualized. No evidence of pulmonic stenosis. Aorta: The aortic root is normal in size and structure. Venous: The inferior vena cava is normal in size with greater than 50% respiratory variability, suggesting right atrial pressure of 3 mmHg. IAS/Shunts: The interatrial septum was not well visualized.  LEFT VENTRICLE PLAX 2D LVIDd:         5.50 cm  Diastology LVIDs:         4.00 cm  LV e' medial:    7.40 cm/s LV PW:         1.30 cm  LV E/e' medial:  6.9 LV IVS:        1.00 cm  LV e'  lateral:   12.20 cm/s LVOT diam:     2.00 cm  LV E/e' lateral: 4.2 LV SV:         59 LV SV Index:   20 LVOT Area:     3.14 cm  RIGHT VENTRICLE RV S prime:     10.20 cm/s TAPSE (M-mode): 1.5 cm LEFT ATRIUM              Index       RIGHT ATRIUM           Index LA diam:        3.80 cm  1.31 cm/m  RA Area:     15.80 cm LA Vol (A2C):   106.0 ml 36.59 ml/m RA Volume:   40.80 ml  14.08 ml/m LA Vol (A4C):   95.3 ml  32.89 ml/m LA Biplane Vol: 103.0 ml 35.55 ml/m  AORTIC VALVE                    PULMONIC VALVE AV Area (Vmax):    1.29 cm     PV Vmax:       1.32 m/s AV Area (Vmean):   1.18 cm     PV Peak grad:  7.0 mmHg AV Area (VTI):     1.16 cm AV Vmax:           297.00 cm/s AV Vmean:          211.000 cm/s AV VTI:            0.505 m AV Peak Grad:      35.3 mmHg AV Mean Grad:      20.0 mmHg LVOT Vmax:         122.00 cm/s LVOT Vmean:        79.000 cm/s LVOT VTI:          0.187  m LVOT/AV VTI ratio: 0.37 MITRAL VALVE MV Area (PHT): 4.49 cm    SHUNTS MV Decel Time: 169 msec    Systemic VTI:  0.19 m MV E velocity: 51.40 cm/s  Systemic Diam: 2.00 cm MV A velocity: 85.30 cm/s MV E/A ratio:  0.60 Jenkins Rouge MD Electronically signed by Jenkins Rouge MD Signature Date/Time: 06/28/2020/2:11:47 PM    Final    VAS US CAROTID  Result Date: 06/05/2020 Carotid Arterial Duplex Study Indications:       Pre operative TAVR. Risk Factors:      Hypertension, hyperlipidemia. Other Factors:     Aortic stenosis, history of leukemia, in remission. OSA. CHF. Limitations        Today's exam was limited due to the patient's respiratory                    variation. Comparison Study:  No prior study Performing Technologist: Sharion Dove RVS  Examination Guidelines: A complete evaluation includes B-mode imaging, spectral Doppler, color Doppler, and power Doppler as needed of all accessible portions of each vessel. Bilateral testing is considered an integral part of a complete examination. Limited examinations for reoccurring indications may  be performed as noted.  Right Carotid Findings: +----------+--------+--------+--------+------------------+------------------+           PSV cm/sEDV cm/sStenosisPlaque DescriptionComments           +----------+--------+--------+--------+------------------+------------------+ CCA Prox  59      23                                intimal thickening +----------+--------+--------+--------+------------------+------------------+ CCA Distal62      22                                intimal thickening +----------+--------+--------+--------+------------------+------------------+ ICA Prox  115     34              heterogenous                         +----------+--------+--------+--------+------------------+------------------+ ICA Distal41      22                                                   +----------+--------+--------+--------+------------------+------------------+ +----------+--------+-------+--------+-------------------+           PSV cm/sEDV cmsDescribeArm Pressure (mmHG) +----------+--------+-------+--------+-------------------+ [id number]                                        +----------+--------+-------+--------+-------------------+ +---------+--------+--+--------+--+ VertebralPSV cm/s48EDV cm/s17 +---------+--------+--+--------+--+  Left Carotid Findings: +----------+--------+--------+--------+------------------+--------+           PSV cm/sEDV cm/sStenosisPlaque DescriptionComments +----------+--------+--------+--------+------------------+--------+ CCA Prox  113     37                                         +----------+--------+--------+--------+------------------+--------+ CCA Distal95      30                                         +----------+--------+--------+--------+------------------+--------+  ICA Prox  87      32              heterogenous               +----------+--------+--------+--------+------------------+--------+ ICA  Distal81      34                                         +----------+--------+--------+--------+------------------+--------+ ECA       83      21                                         +----------+--------+--------+--------+------------------+--------+ +----------+--------+--------+--------+-------------------+           PSV cm/sEDV cm/sDescribeArm Pressure (mmHG) +----------+--------+--------+--------+-------------------+ Subclavian69                                          +----------+--------+--------+--------+-------------------+ +---------+--------+--+--------+--+ VertebralPSV cm/s37EDV cm/s19 +---------+--------+--+--------+--+   Summary: Right Carotid: Velocities in the right ICA are consistent with a 1-39% stenosis. Left Carotid: Velocities in the left ICA are consistent with a 1-39% stenosis. Vertebrals:  Bilateral vertebral arteries demonstrate antegrade flow. Subclavians: Normal flow hemodynamics were seen in bilateral subclavian              arteries. *See table(s) above for measurements and observations.  Electronically signed by Monica Martinez MD on 06/05/2020 at 4:25:41 PM.    Final    ECHOCARDIOGRAM LIMITED  Result Date: 06/27/2020    ECHOCARDIOGRAM LIMITED REPORT   Patient Name:   Dakota Meyer Date of Exam: 06/27/2020 Medical Rec #:  [id number]       Height:       73.0 in Accession #:    [id number]      Weight:       402.8 lb Date of Birth:  [birth year]        BSA:          2.897 m Patient Age:    87 years        BP:           150/101 mmHg Patient Gender: M               HR:           103 bpm. Exam Location:  Inpatient Procedure: Limited Echo, Limited Color Doppler, Cardiac Doppler and Echo            Assisted Procedure Indications:     Aortic Stenosis; TAVR Procedure  History:         Patient has prior history of Echocardiogram examinations, most                  recent 05/16/2020. Risk Factors:Hypertension.                  Aortic Valve: 29 mm Sapien prosthetic,  stented (TAVR) valve is                  present in the aortic position. Procedure Date: 06/27/2020.  Sonographer:     Mikki Santee RDCS (AE) Referring Phys:  [place] Diagnosing Phys: Jenkins Rouge MD IMPRESSIONS  1. Left ventricular ejection fraction, by estimation, is  35 to 40%. The left ventricle has moderately decreased function. The left ventricle demonstrates global hypokinesis. The left ventricular internal cavity size was moderately dilated. There is mild  left ventricular hypertrophy.  2. Left atrial size was mildly dilated.  3. The mitral valve is grossly normal. Trivial mitral valve regurgitation.  4. Pre TAVR: poor quality images due to morbid obesity. Calcified tri leaflet valve with restricted leaflet motion mean gradient 29 peak 45 mmHg supine in OR AVA 0.80 cm2         Post TAVR: well positioned 29 mm Sapien 3 valve Trivial PVL along the septal edge of the skirt. Mean gradient 6 mmHg peak 11 mmHg AVA 1.6 cm2. The aortic valve was not well visualized. Severe aortic valve stenosis. There is a 29 mm Sapien prosthetic (TAVR) valve present in the aortic position. Procedure Date: 06/27/2020. FINDINGS  Left Ventricle: Left ventricular ejection fraction, by estimation, is 35 to 40%. The left ventricle has moderately decreased function. The left ventricle demonstrates global hypokinesis. The left ventricular internal cavity size was moderately dilated. There is mild left ventricular hypertrophy. Left Atrium: Left atrial size was mildly dilated. Pericardium: There is no evidence of pericardial effusion. Mitral Valve: The mitral valve is grossly normal. Trivial mitral valve regurgitation. Tricuspid Valve: Tricuspid valve regurgitation is mild. Aortic Valve: Pre TAVR: poor quality images due to morbid obesity. Calcified tri leaflet valve with restricted leaflet motion mean gradient 29 peak 45 mmHg supine in OR AVA 0.80 cm2 Post TAVR: well positioned 29 mm Sapien 3 valve Trivial PVL along the septal  edge of the skirt. Mean gradient 6 mmHg peak 11 mmHg AVA 1.6 cm2. The aortic valve was not well visualized. Severe aortic stenosis is present. Aortic valve mean gradient measures 21.0 mmHg. Aortic valve peak gradient measures 30.8 mmHg. Aortic valve area, by VTI measures 1.22 cm. There is a 29 mm Sapien prosthetic, stented (TAVR) valve present in the aortic position. Procedure Date: 06/27/2020. LEFT VENTRICLE PLAX 2D LVOT diam:     2.20 cm LV SV:         78 LV SV Index:   27 LVOT Area:     3.80 cm  AORTIC VALVE AV Area (Vmax):    1.38 cm AV Area (Vmean):   1.28 cm AV Area (VTI):     1.22 cm AV Vmax:           277.33 cm/s AV Vmean:          210.000 cm/s AV VTI:            0.640 m AV Peak Grad:      30.8 mmHg AV Mean Grad:      21.0 mmHg LVOT Vmax:         100.90 cm/s LVOT Vmean:        70.800 cm/s LVOT VTI:          0.206 m LVOT/AV VTI ratio: 0.32  SHUNTS Systemic VTI:  0.21 m Systemic Diam: 2.20 cm Jenkins Rouge MD Electronically signed by Jenkins Rouge MD Signature Date/Time: 06/27/2020/2:13:25 PM    Final    Structural Heart Procedure  Result Date: 06/27/2020 See surgical note for result.  CT Angio Abd/Pel w/ and/or w/o  Result Date: 06/06/2020 CLINICAL DATA:  65 year old male with history of severe aortic stenosis. Preprocedural study prior to potential transcatheter aortic valve replacement (TAVR) procedure. EXAM: CT ANGIOGRAPHY CHEST, ABDOMEN AND PELVIS TECHNIQUE: Non-contrast CT of the chest was initially obtained. Multidetector CT imaging through the chest,  abdomen and pelvis was performed using the standard protocol during bolus administration of intravenous contrast. Multiplanar reconstructed images and MIPs were obtained and reviewed to evaluate the vascular anatomy. CONTRAST:  165mL OMNIPAQUE IOHEXOL 350 MG/ML SOLN COMPARISON:  No priors. FINDINGS: CTA CHEST FINDINGS Cardiovascular: Heart size is borderline enlarged with concentric left ventricular hypertrophy. There is no significant pericardial  fluid, thickening or pericardial calcification. There is aortic atherosclerosis, as well as atherosclerosis of the great vessels of the mediastinum and the coronary arteries, including calcified atherosclerotic plaque in the left anterior descending and left circumflex coronary arteries. Severe calcifications of the aortic valve. Mediastinum/Lymph Nodes: No pathologically enlarged mediastinal or hilar lymph nodes. Please note that accurate exclusion of hilar adenopathy is limited on noncontrast CT scans. Esophagus is unremarkable in appearance. No axillary lymphadenopathy. Lungs/Pleura: No acute consolidative airspace disease. No pleural effusions. No suspicious appearing pulmonary nodules or masses are noted. Areas of lucency interspersed with areas of ground-glass attenuation with intervening geographic margins, strongly suggestive of air trapping from small airways disease. Musculoskeletal/Soft Tissues: There are no aggressive appearing lytic or blastic lesions noted in the visualized portions of the skeleton. CTA ABDOMEN AND PELVIS FINDINGS Hepatobiliary: No suspicious cystic or solid hepatic lesions. No intra or extrahepatic biliary ductal dilatation. Mild diffuse low attenuation throughout the hepatic parenchyma, indicative of hepatic steatosis. Status post cholecystectomy. Pancreas: No pancreatic mass. No pancreatic ductal dilatation. No pancreatic or peripancreatic fluid collections or inflammatory changes. Spleen: Calcified granulomas in the spleen. Otherwise, unremarkable. Adrenals/Urinary Tract: Bilateral kidneys and adrenal glands are normal in appearance. No hydroureteronephrosis. Urinary bladder is normal in appearance. Stomach/Bowel: The appearance of the stomach is normal. No pathologic dilatation of small bowel or colon. Normal appendix. Vascular/Lymphatic: Aortic atherosclerosis, without evidence of aneurysm or dissection in the abdominal or pelvic vasculature. Vascular findings and measurements  pertinent to potential TAVR procedure, as detailed below. No lymphadenopathy noted in the abdomen or pelvis. Reproductive: Prostate gland and seminal vesicles are unremarkable in appearance. Other: No significant volume of ascites.  No pneumoperitoneum. Musculoskeletal: There are no aggressive appearing lytic or blastic lesions noted in the visualized portions of the skeleton. VASCULAR MEASUREMENTS PERTINENT TO TAVR: Comment: Today's study is of very poor technical quality secondary to suboptimal contrast bolus and the patient's large body habitus which creates excessive image noise. As a result, vascular findings and measurements below are best estimates given that image quality was considered nearly nondiagnostic. AORTA: Minimal Aortic Diameter-22 x 18 mm Severity of Aortic Calcification-mild RIGHT PELVIS: Right Common Iliac Artery - Minimal Diameter-12.7 x 13.4 mm Tortuosity-mild Calcification-mild Right External Iliac Artery - Minimal Diameter-8.0 x 6.8 mm Tortuosity-severe Calcification-none Right Common Femoral Artery - Minimal Diameter-8.1 x 6.3 mm Tortuosity-mild Calcification-mild LEFT PELVIS: Left Common Iliac Artery - Minimal Diameter-14.1 x 13.3 mm Tortuosity-mild Calcification-none Left External Iliac Artery - Minimal Diameter-9.5 x 8.0 mm Tortuosity-severe Calcification-none Left Common Femoral Artery - Minimal Diameter-9.4 x 8.2 mm Tortuosity-mild Calcification-none Review of the MIP images confirms the above findings. IMPRESSION: 1. Vascular findings and measurements pertinent to potential TAVR procedure, as detailed above. 2. Severe thickening and calcification of the aortic valve, compatible with reported clinical history of severe aortic stenosis. 3. Concentric left ventricular hypertrophy. 4. Evidence of air trapping in the lungs, suggestive of small airways disease. 5. Hepatic steatosis. 6. Additional incidental findings, as above. Electronically Signed   By: Vinnie Langton M.D.   On:  06/06/2020 05:52   Disposition   Pt is being discharged home today in good condition.  Follow-up Plans & Appointments     Follow-up Information    Eileen Stanford, PA-C. Go on 07/05/2020.   Specialties: Cardiology, Radiology Why: @ 1pm, please arrive at least 10 minutes early.  Contact information: [address] [place] [address] [phone]                Discharge Medications   Allergies as of 06/28/2020   No Known Allergies     Medication List    STOP taking these medications   hydrochlorothiazide 25 MG tablet Commonly known as: HYDRODIURIL     TAKE these medications   amLODipine 10 MG tablet Commonly known as: NORVASC Take 0.5 tablets (5 mg total) by mouth daily.   aspirin EC 81 MG tablet Take 1 tablet (81 mg total) by mouth daily. Swallow whole.   atorvastatin 20 MG tablet Commonly known as: LIPITOR Take 20 mg by mouth at bedtime.   buPROPion 150 MG 24 hr tablet Commonly known as: WELLBUTRIN XL Take 150 mg by mouth 2 (two) times daily.   buPROPion 150 MG 12 hr tablet Commonly known as: WELLBUTRIN SR Take 150 mg by mouth 2 (two) times daily.   carvedilol 12.5 MG tablet Commonly known as: COREG Take 1 tablet (12.5 mg total) by mouth 2 (two) times daily.   clopidogrel 75 MG tablet Commonly known as: PLAVIX Take 1 tablet (75 mg total) by mouth daily with breakfast. Start taking on: Jun 29, 2020   Cod Liver Oil Oil Take 1 capsule by mouth daily. 375 mg vitamin A 415 mg cod liver oil   diazepam 10 MG tablet Commonly known as: VALIUM Take 10 mg by mouth daily as needed for anxiety.   diclofenac Sodium 1 % Gel Commonly known as: VOLTAREN Apply 2 g topically daily as needed (pain).   DULoxetine 60 MG capsule Commonly known as: CYMBALTA Take 60 mg by mouth daily.   empagliflozin 10 MG Tabs tablet Commonly known as: Jardiance Take 1 tablet (10 mg total) by mouth daily before breakfast.   Entresto 49-51 MG Generic  drug: sacubitril-valsartan Take 1 tablet by mouth 2 (two) times daily.   eplerenone 25 MG tablet Commonly known as: INSPRA Take 0.5 tablets (12.5 mg total) by mouth daily.   Fish Oil 1200 MG Cpdr Take 1,200 mg by mouth daily.   meloxicam 15 MG tablet Commonly known as: MOBIC Take 15 mg by mouth daily as needed for pain.   metoprolol tartrate 50 MG tablet Commonly known as: LOPRESSOR Take 1 tablet (50 mg total) by mouth once for 1 dose. Please take 2 hr prior to CT scans   OVER THE COUNTER MEDICATION Take 1 capsule by mouth daily. Go-Out   oxyCODONE-acetaminophen 10-325 MG tablet Commonly known as: PERCOCET Take 2 tablets by mouth 3 (three) times daily as needed for pain.   Rybelsus 3 MG Tabs Generic drug: Semaglutide Take 3 mg by mouth daily before breakfast.   tamsulosin 0.4 MG Caps capsule Commonly known as: FLOMAX Take 0.4 mg by mouth daily.         Outstanding Labs/Studies   none  Duration of Discharge Encounter   Greater than 30 minutes including physician time.  Mable Fill, PA-C 06/28/2020, 3:14 PM [phone]  Patient seen, examined. Available data reviewed. Agree with findings, assessment, and plan as outlined by Nell Range, PA-C.  The patient is personally interviewed and examined.  He is alert, oriented, no distress.  The patient is morbidly obese.  Heart  is regular rate and rhythm with 2/6 systolic ejection murmur at the right upper sternal border, lung fields are clear bilaterally, bilateral groin sites are clear, extremities have trace edema.  Postoperative day #1 echo demonstrates normal function of the patient's TAVR bioprosthesis with mean gradient of 20 mmHg and no paravalvular regurgitation.  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  Overall I think this is a good result for his TAVR procedure.  He is medically stable for hospital discharge today.  His medicine program is reviewed and  will be continued as above.  Telemetry was reviewed this morning and he had a short run of what I think is atrial tachycardia but now remains in sinus rhythm.  All of his medications were restarted.  Sherren Mocha, M.D. 06/28/2020 5:20 PM

## 2020-06-28 NOTE — Discharge Instructions (Signed)
ACTIVITY AND EXERCISE °• Daily activity and exercise are an important part of your recovery. People recover at different rates depending on their general health and type of valve procedure. °• Most people recovering from TAVR feel better relatively quickly  °• No lifting, pushing, pulling more than 10 pounds (examples to avoid: groceries, vacuuming, gardening, golfing): °            - For one week with a procedure through the groin. °            - For six weeks for procedures through the chest wall or neck. °NOTE: You will typically see one of our providers 7-14 days after your procedure to discuss WHEN TO RESUME the above activities.  °  °  °DRIVING °• Do not drive until you are seen for follow up and cleared by a provider. Generally, we ask patient to not drive for 1 week after their procedure. °• If you have been told by your doctor in the past that you may not drive, you must talk with him/her before you begin driving again. °  °DRESSING °• Groin site: you may leave the clear dressing over the site for up to one week or until it falls off. °  °HYGIENE °• If you had a femoral (leg) procedure, you may take a shower when you return home. After the shower, pat the site dry. Do NOT use powder, oils or lotions in your groin area until the site has completely healed. °• If you had a chest procedure, you may shower when you return home unless specifically instructed not to by your discharging practitioner. °            - DO NOT scrub incision; pat dry with a towel. °            - DO NOT apply any lotions, oils, powders to the incision. °            - No tub baths / swimming for at least 2 weeks. °• If you notice any fevers, chills, increased pain, swelling, bleeding or pus, please contact your doctor. °  °ADDITIONAL INFORMATION °• If you are going to have an upcoming dental procedure, please contact our office as you will require antibiotics ahead of time to prevent infection on your heart valve.  ° ° °If you have any  questions or concerns you can call the structural heart phone during normal business hours 8am-4pm. If you have an urgent need after hours or weekends please call [phone] to talk to the on call provider for general cardiology. If you have an emergency that requires immediate attention, please call 911.  ° ° °After TAVR Checklist ° °Check  Test Description  ° Follow up appointment in 1-2 weeks  You will see our structural heart physician assistant, Katie Katlin Ciszewski. Your incision sites will be checked and you will be cleared to drive and resume all normal activities if you are doing well.    ° 1 month echo and follow up  You will have an echo to check on your new heart valve and be seen back in the office by Katie Demita Tobia. Many times the echo is not read by your appointment time, but Katie will call you later that day or the following day to report your results.  ° Follow up with your primary cardiologist You will need to be seen by your primary cardiologist in the following 3-6 months after your 1 month appointment in the valve   clinic. Often times your Plavix or Aspirin will be discontinued during this time, but this is decided on a case by case basis.   ° 1 year echo and follow up You will have another echo to check on your heart valve after 1 year and be seen back in the office by Katie Avi Kerschner. This your last structural heart visit.  ° Bacterial endocarditis prophylaxis  You will have to take antibiotics for the rest of your life before all dental procedures (even teeth cleanings) to protect your heart valve. Antibiotics are also required before some surgeries. Please check with your cardiologist before scheduling any surgeries. Also, please make sure to tell us if you have a penicillin allergy as you will require an alternative antibiotic.   ° ° °

## 2020-06-28 NOTE — Progress Notes (Signed)
Mobility Specialist - Progress Note   06/28/20 1107  Mobility  Activity Transferred:  Bed to chair  Level of Assistance Minimal assist, patient does 75% or more  Assistive Device Front wheel walker  Mobility Response Tolerated well  Mobility performed by Mobility specialist  $Mobility charge 1 Mobility   Pt refusing to ambulate due to bilateral knee pain, he agreed to pivot to chair. His HR remained in 90s throughout. Pt left in recliner w/ chair alarm on and call bell at side.  Pricilla Handler Mobility Specialist Mobility Specialist Phone: [phone]

## 2020-06-28 NOTE — Progress Notes (Incomplete)
  Echocardiogram 2D Echocardiogram has been performed.  Dakota Meyer  Lynnette Caffey 06/28/2020, 1:39 PM

## 2020-06-29 ENCOUNTER — Telehealth: Payer: Self-pay | Admitting: Physician Assistant

## 2020-06-29 NOTE — Telephone Encounter (Signed)
Attempted TOC call. Left Vm  Angelena Form PA-C  [facility]

## 2020-06-30 NOTE — Telephone Encounter (Signed)
Patient contacted regarding discharge from [facility] on 06/28/2020.  Patient understands to follow up with provider Nell Range PA-C on 07/05/2020 at 1:00 PM at [facility] office. Patient understands discharge instructions? yes Patient understands medications and regiment? yes Patient understands to bring all medications to this visit? Yes  The pt is doing well at this time and has no complaints.

## 2020-07-04 NOTE — Progress Notes (Signed)
[facility] [place]                                     Cardiology Office Note:    Date:  07/05/2020   ID:  Gwynneth Aliment, DOB 1956-01-16, MRN [id number]  PCP:  Candi Leash, PA-C  [facility] Cardiologist:  Donato Heinz, MD / Dr. Burt Knack & Dr. Cyndia Bent (TAVR) [facility] Electrophysiologist:  None   Referring MD: Candi Leash, PA-C   [facility] s/p TAVR  History of Present Illness:    Dakota Meyer is a 65 y.o. 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  Pt has developed progressive dyspnea and fatigue. He has severe degenerative arthritis of both knees and needs knee replacements which decreases his mobility and increases exertion with walking. He also reported dizziness with standing.Echocardiogram in September 2021 showed moderate calcification of the aortic valve with a mean gradient of 26 mmHg and a peak gradient 43 mmHg. Dimensionless index was 0.29. Left ventricular ejection fraction was 35 to 40% with global hypokinesis and grade 1 diastolic dysfunction. His most recent echocardiogram on 05/16/2020 showed an increase in the mean gradient to 36 mmHg with a peak gradient of 61.5 mmHg. Dimensionless index of 0.33.Left ventricular ejection fraction is 30 to 35% with grade 1 diastolic dysfunction. He subsequently underwent cardiac catheterization on 05/25/2020 which showed angiographically normal coronary arteries. Right heart pressures were normal with high cardiac output. The peak to peak gradient across aortic valve was 52 mmHg with a mean gradient of 37 mmHg. Aortic valve area was measured at 1.7 cm.  He was evaluated by the multidisciplinary valve team and underwent successful TAVR with a 29 mm Edwards Sapien 3 THV via the TF  approach on 06/27/20. Post operative echo showed EF 35-40%, normally functioning TAVR with a mean gradient of 20 mmHg (likely elevated due to patient prosthesis mismatch due to body habitus) and no PVL. He was continued on Asprin and started on Plavix 75 mg daily. He did complain of orthostasis symptoms and his HCTZ was discontinued. He was resumed on all other CHF meds.   Today he presents to clinic for follow up. Here alone. Feeling better since TAVR. Breathing improved. No chest pain. No more dizziness when standing since TAVR. No syncope. No LE edema, orthopnea or PND. Feels less fatigued.    Past Medical History:  Diagnosis Date  . Anxiety   . Chronic knee pain   . Depression   . Hypertension   . Leukemia ([place]) 1997  . Morbid obesity ([place])   . Osteoarthritis 1998  . 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 Burt Knack and Dr. Cyndia Bent.   . Sleep apnea   . Tinnitus aurium   . Venous stasis     Past Surgical History:  Procedure Laterality Date  . ANAL FISSURE REPAIR    . FRACTURE SURGERY    . INTRAOPERATIVE TRANSTHORACIC ECHOCARDIOGRAM Left 06/27/2020   Procedure: INTRAOPERATIVE TRANSTHORACIC ECHOCARDIOGRAM;  Surgeon: Sherren Mocha, MD;  Location: [facility];  Service: Open Heart Surgery;  Laterality: Left;  . RIGHT/LEFT HEART CATH AND CORONARY  ANGIOGRAPHY N/A 05/25/2020   Procedure: RIGHT/LEFT HEART CATH AND CORONARY ANGIOGRAPHY;  Surgeon: Sherren Mocha, MD;  Location: [place] [facility];  Service: Cardiovascular;  Laterality: N/A;  . ROTATOR CUFF REPAIR Right   . TRANSCATHETER AORTIC VALVE REPLACEMENT, TRANSFEMORAL N/A 06/27/2020   Procedure: TRANSCATHETER AORTIC VALVE REPLACEMENT, TRANSFEMORAL;  Surgeon: Sherren Mocha, MD;  Location: [place];  Service: Open Heart Surgery;  Laterality: N/A;  . ULTRASOUND GUIDANCE FOR VASCULAR ACCESS Bilateral 06/27/2020   Procedure: ULTRASOUND GUIDANCE FOR VASCULAR ACCESS;   Surgeon: Sherren Mocha, MD;  Location: [place];  Service: Open Heart Surgery;  Laterality: Bilateral;    Current Medications: Current Meds  Medication Sig  . amLODipine (NORVASC) 10 MG tablet Take 0.5 tablets (5 mg total) by mouth daily.  Marland Kitchen amoxicillin (AMOXIL) 500 MG tablet Take 4 tablets (2,000 mg total) one hour prior to all dental visits.  Marland Kitchen aspirin EC 81 MG tablet Take 1 tablet (81 mg total) by mouth daily. Swallow whole.  Marland Kitchen atorvastatin (LIPITOR) 20 MG tablet Take 20 mg by mouth at bedtime.  Marland Kitchen buPROPion (WELLBUTRIN SR) 150 MG 12 hr tablet Take 150 mg by mouth 2 (two) times daily.  . carvedilol (COREG) 12.5 MG tablet Take 1 tablet (12.5 mg total) by mouth 2 (two) times daily.  . clopidogrel (PLAVIX) 75 MG tablet Take 1 tablet (75 mg total) by mouth daily with breakfast.  . Cod Liver Oil OIL Take 1 capsule by mouth daily. 375 mg vitamin A 415 mg cod liver oil  . diazepam (VALIUM) 10 MG tablet Take 10 mg by mouth daily as needed for anxiety.  . diclofenac Sodium (VOLTAREN) 1 % GEL Apply 2 g topically daily as needed (pain).  . DULoxetine (CYMBALTA) 60 MG capsule Take 60 mg by mouth daily.  . empagliflozin (JARDIANCE) 10 MG TABS tablet Take 1 tablet (10 mg total) by mouth daily before breakfast.  . eplerenone (INSPRA) 25 MG tablet Take 0.5 tablets (12.5 mg total) by mouth daily.  . meloxicam (MOBIC) 15 MG tablet Take 15 mg by mouth daily as needed for pain.  . Omega-3 Fatty Acids (FISH OIL) 1200 MG CPDR Take 1,200 mg by mouth daily.  Marland Kitchen OVER THE COUNTER MEDICATION Take 1 capsule by mouth daily. Go-Out  . oxyCODONE-acetaminophen (PERCOCET) 10-325 MG tablet Take 2 tablets by mouth 3 (three) times daily as needed for pain.  . sacubitril-valsartan (ENTRESTO) 49-51 MG Take 1 tablet by mouth 2 (two) times daily.  . Semaglutide (RYBELSUS) 3 MG TABS Take 3 mg by mouth daily before breakfast.  . tamsulosin (FLOMAX) 0.4 MG CAPS capsule Take 0.4 mg by mouth daily.     Allergies:   Patient has no  known allergies.   Social History   Socioeconomic History  . Marital status: Married    Spouse name: Not on file  . Number of children: Not on file  . Years of education: Not on file  . Highest education level: Not on file  Occupational History  . Not on file  Tobacco Use  . Smoking status: Never Smoker  . Smokeless tobacco: Never Used  Vaping Use  . Vaping Use: Never used  Substance and Sexual Activity  . Alcohol use: Yes    Comment: occasionally  . Drug use: No  . Sexual activity: Not on file  Other Topics Concern  . Not on file  Social History Narrative  . Not on file   Social Determinants of Health   Financial Resource Strain: Not on file  Food Insecurity: Not on file  Transportation Needs: Not on file  Physical Activity: Not on file  Stress: Not on file  Social Connections: Not on file     Family History: The patient's \\family  history includes Aneurysm in his mother; Cancer in his father and mother.  ROS:   Please see the history of present illness.    All other systems reviewed and are negative.  EKGs/Labs/Other Studies Reviewed:    The following studies were reviewed today:    TAVR OPERATIVE NOTE   Date of Procedure:06/27/2020  Preoperative Diagnosis:Severe Aortic Stenosis   Postoperative Diagnosis:Same   Procedure:   Transcatheter Aortic Valve Replacement - Percutaneous Transfemoral Approach Edwards Sapien 3 THV (size 62mm, model # 9600TFX, serial # [id number])  Co-Surgeons:Bryan Alveria Apley, MD and Sherren Mocha, MD  Anesthesiologist:David Linna Caprice, MD  Echocardiographer:Peter Johnsie Cancel, MD  Pre-operative Echo Findings: ? Severe aortic stenosis ? Normalleft ventricular systolic function  Post-operative Echo Findings: ? Trivialparavalvular leak ? unchangedleft ventricular systolic function  _____________    Echo  06/28/20:  IMPRESSIONS 1. Diffuse hyokinesis worse in the inferior wall abnormal septal motion.  Left ventricular ejection fraction, by estimation, is 35 to 40%. The left  ventricle has moderately decreased function. The left ventricle has no  regional wall motion abnormalities.  The left ventricular internal cavity size was moderately dilated. Left  ventricular diastolic parameters were normal.  2. Right ventricular systolic function is normal. The right ventricular  size is normal.  3. The mitral valve is normal in structure. No evidence of mitral valve  regurgitation. No evidence of mitral stenosis.  4. Post TAVR with 29 mm Sapien 3 valve no significant PVL mean gradient  20 mmHg peak 35 mmHg DVI 0.37 and AVR 1.3 cm2 Gradients have increased  since implant 06/27/20. The aortic valve has been repaired/replaced. Aortic  valve regurgitation is not visualized. No aortic stenosis is present. Procedure Date: 06/27/2020.  5. The inferior vena cava is normal in size with greater than 50%  respiratory variability, suggesting right atrial pressure of 3 mmHg.    EKG:  EKG is ordered today.  The ekg ordered today demonstrates sinus with LBBB  Recent Labs: 04/10/2020: BNP 99.7; TSH 1.870 06/23/2020: ALT 11 06/28/2020: BUN 19; Creatinine, Ser 1.21; Hemoglobin 11.0; Magnesium 1.8; Platelets 175; Potassium 3.8; Sodium 138  Recent Lipid Panel    Component Value Date/Time   CHOL 170 04/10/2020 1558   TRIG 182 (H) 04/10/2020 1558   HDL 43 04/10/2020 1558   CHOLHDL 4.0 04/10/2020 1558   LDLCALC 96 04/10/2020 1558     Risk Assessment/Calculations:       Physical Exam:    VS:  BP 95/67 (BP Location: Left Arm, Patient Position: Sitting, Cuff Size: Normal)   Pulse 79   Ht 6\' 1"  (1.854 m)   Wt (!) 402 lb (182.3 kg)   SpO2 94%   BMI 53.04 kg/m     Wt Readings from Last 3 Encounters:  07/05/20 (!) 402 lb (182.3 kg)  06/27/20 (!) 402 lb 12.5 oz (182.7 kg)  06/23/20 (!) 402 lb 12.8 oz (182.7  kg)     GEN: Well nourished, well developed in no acute distress, morbidly obese HEENT: Normal NECK: No JVD LYMPHATICS: No lymphadenopathy CARDIAC: RRR, no murmurs, rubs, gallops RESPIRATORY:  Clear to auscultation without rales, wheezing or rhonchi  ABDOMEN: Soft, non-tender, non-distended MUSCULOSKELETAL:  No edema; No deformity  SKIN: Warm and dry.  Groin sites clear without hematoma or ecchymosis  NEUROLOGIC:  Alert  and oriented x 3 PSYCHIATRIC:  Normal affect   ASSESSMENT:    1. S/P TAVR (transcatheter aortic valve replacement)   2. Hypertension, unspecified type   3. Chronic combined systolic (congestive) and diastolic (congestive) heart failure ([place])   4. Morbid obesity ([place])    PLAN:    In order of problems listed above:  Severe AS s/p TAVR:doing great with a clinical improvement since TAVR. Groin sites are essentially healed. ECG with sinus and new LBBB (previously IVCD). Continued on aspirin and plavix. SBE prophylaxis discussed; I have RX'd amoxicillin. I will see him back for 1 month follow up and echo  HTN: Bp soft but orthostatic symptoms resolved since stopping HCTZ. Bp treated in the context of CHF treatment   Chronic combined S/D CHF: appears euvolemic. Continue GDMT with Entresto, Coreg, Jardiance, eplerenone.   Morbid obesity: Body mass index is 53.14 kg/m. Counseled on importance of diet and exercise.   Medication Adjustments/Labs and Tests Ordered: Current medicines are reviewed at length with the patient today.  Concerns regarding medicines are outlined above.  Orders Placed This Encounter  Procedures  . EKG 12-Lead   Meds ordered this encounter  Medications  . amoxicillin (AMOXIL) 500 MG tablet    Sig: Take 4 tablets (2,000 mg total) one hour prior to all dental visits.    Dispense:  8 tablet    Refill:  12    Patient Instructions  Medication Instructions:  Your provider discussed the importance of taking an antibiotic prior to all dental  visits to prevent damage to the heart valves from infection. You were given a prescription for AMOXICILLIN 2,000mg  to take one hour prior to any dental appointment.  *If you need a refill on your cardiac medications before your next appointment, please call your pharmacy*   Follow-Up: Please keep your appointments as scheduled!    Signed, Angelena Form, PA-C  07/05/2020 1:49 PM    [facility]

## 2020-07-05 ENCOUNTER — Ambulatory Visit: Payer: Medicare Other | Admitting: Physician Assistant

## 2020-07-05 ENCOUNTER — Encounter: Payer: Self-pay | Admitting: Physician Assistant

## 2020-07-05 ENCOUNTER — Other Ambulatory Visit: Payer: Self-pay

## 2020-07-05 VITALS — BP 95/67 | HR 79 | Ht 73.0 in | Wt >= 6400 oz

## 2020-07-05 DIAGNOSIS — I10 Essential (primary) hypertension: Secondary | ICD-10-CM | POA: Diagnosis not present

## 2020-07-05 DIAGNOSIS — I5042 Chronic combined systolic (congestive) and diastolic (congestive) heart failure: Secondary | ICD-10-CM

## 2020-07-05 DIAGNOSIS — Z952 Presence of prosthetic heart valve: Secondary | ICD-10-CM

## 2020-07-05 MED ORDER — AMOXICILLIN 500 MG PO TABS: ORAL_TABLET | ORAL | 12 refills | Status: AC

## 2020-07-05 NOTE — Patient Instructions (Signed)
Medication Instructions:  Your provider discussed the importance of taking an antibiotic prior to all dental visits to prevent damage to the heart valves from infection. You were given a prescription for AMOXICILLIN 2,000 mg to take one hour prior to any dental appointment.  *If you need a refill on your cardiac medications before your next appointment, please call your pharmacy*  Follow-Up: Please keep your appointments as scheduled! 

## 2020-07-27 ENCOUNTER — Other Ambulatory Visit: Payer: Self-pay

## 2020-07-27 ENCOUNTER — Ambulatory Visit (HOSPITAL_COMMUNITY): Payer: Medicare Other | Attending: Physician Assistant

## 2020-07-27 ENCOUNTER — Encounter: Payer: Self-pay | Admitting: Physician Assistant

## 2020-07-27 ENCOUNTER — Ambulatory Visit: Payer: Medicare Other | Admitting: Physician Assistant

## 2020-07-27 VITALS — BP 130/80 | HR 76 | Ht 73.0 in | Wt >= 6400 oz

## 2020-07-27 DIAGNOSIS — Z952 Presence of prosthetic heart valve: Secondary | ICD-10-CM | POA: Diagnosis not present

## 2020-07-27 DIAGNOSIS — I5042 Chronic combined systolic (congestive) and diastolic (congestive) heart failure: Secondary | ICD-10-CM | POA: Diagnosis not present

## 2020-07-27 DIAGNOSIS — I10 Essential (primary) hypertension: Secondary | ICD-10-CM | POA: Diagnosis not present

## 2020-07-27 LAB — ECHOCARDIOGRAM COMPLETE
AR max vel: 1.64 cm2
AV Area VTI: 1.63 cm2
AV Area mean vel: 1.5 cm2
AV Mean grad: 16.5 mmHg
AV Peak grad: 29.2 mmHg
Ao pk vel: 2.7 m/s
Area-P 1/2: 3.68 cm2
P 1/2 time: 405 msec
S' Lateral: 4.15 cm

## 2020-07-27 MED ORDER — CLOPIDOGREL BISULFATE 75 MG PO TABS: 75.0000 mg | ORAL_TABLET | Freq: Every day | ORAL | 1 refills | Status: AC

## 2020-07-27 NOTE — Patient Instructions (Signed)
Medication Instructions:  Stop Plavix after you finish your second bottle (around 12/27/20)  *If you need a refill on your cardiac medications before your next appointment, please call your pharmacy*   Lab Work: none If you have labs (blood work) drawn today and your tests are completely normal, you will receive your results only by: Marland Kitchen MyChart Message (if you have MyChart) OR . A paper copy in the mail If you have any lab test that is abnormal or we need to change your treatment, we will call you to review the results.   Testing/Procedures: none   Follow-Up: At [facility], you and your health needs are our priority.  As part of our continuing mission to provide you with exceptional heart care, we have created designated Provider Care Teams.  These Care Teams include your primary Cardiologist (physician) and Advanced Practice Providers (APPs -  Physician Assistants and Nurse Practitioners) who all work together to provide you with the care you need, when you need it.  We recommend signing up for the patient portal called "MyChart".  Sign up information is provided on this After Visit Summary.  MyChart is used to connect with patients for Virtual Visits (Telemedicine).  Patients are able to view lab/test results, encounter notes, upcoming appointments, etc.  Non-urgent messages can be sent to your provider as well.   To learn more about what you can do with MyChart, go to [email or web address].    Your next appointment:   4-5 month(s)  The format for your next appointment:   In Person  Provider:   You may see Donato Heinz, MD or one of the following Advanced Practice Providers on your designated Care Team:    Rosaria Ferries, PA-C  Jory Sims, DNP, ANP    Other Instructions

## 2020-07-27 NOTE — Progress Notes (Signed)
HEART AND [place]                                     Cardiology Office Note:    Date:  07/28/2020   ID:  Dakota Meyer, DOB 12-10-1955, MRN [id number]  PCP:  Candi Leash, PA-C  [facility] Cardiologist:  Donato Heinz, MD / Dr. Burt Knack & Dr. Cyndia Bent (TAVR) [facility] Electrophysiologist:  None   Referring MD: Candi Leash, PA-C   1 month s/p TAVR  History of Present Illness:    Dakota Meyer is a 65 y.o. 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  Pt has developed progressive dyspnea and fatigue. He has severe degenerative arthritis of both knees and needs knee replacements which decreases his mobility and increases exertion with walking. He also reported dizziness with standing.Echocardiogram in September 2021 showed moderate calcification of the aortic valve with a mean gradient of 26 mmHg and a peak gradient 43 mmHg. Dimensionless index was 0.29. Left ventricular ejection fraction was 35 to 40% with global hypokinesis and grade 1 diastolic dysfunction. Repeat echocardiogram on 05/16/2020 showed an increase in the mean gradient to 36 mmHg with a peak gradient of 61.5 mmHg. Dimensionless index of 0.33.Left ventricular ejection fraction is 30 to 35% with grade 1 diastolic dysfunction. He subsequently underwent cardiac catheterization on 05/25/2020 which showed angiographically normal coronary arteries. Right heart pressures were normal with high cardiac output. The peak to peak gradient across aortic valve was 52 mmHg with a mean gradient of 37 mmHg. Aortic valve area was measured at 1.7 cm.  He was evaluated by the multidisciplinary valve team and underwent successful TAVR with a 29 mm Edwards Sapien 3 THV via the TF approach on  06/27/20. Post operative echo showed EF 35-40%, normally functioning TAVR with a mean gradient of 20 mmHg (likely elevated due to patient prosthesis mismatch due to body habitus) and no PVL. He was continued on Asprin and started on Plavix 75 mg daily. He did complain of orthostasis symptoms and his HCTZ was discontinued. He was resumed on all other CHF meds. He had significant improvement since TAVR.   Today he presents to clinic for follow up. Had a chest pain that is worse with touch and related to a muscle strain. He does get dyspnea on exertion. He has a lot of fatigue but also struggles with insomnia. Has some dyspnea on exertion but overall symptoms have improved a lot since TAVR and he has better exercise tolerance. No LE edema, orthopnea or PND. No dizziness or syncope. No blood in stool or urine. No palpitations   Past Medical History:  Diagnosis Date  . Anxiety   . Chronic knee pain   . Depression   . Hypertension   . Leukemia ([place]) 1997  . Morbid obesity ([place])   . Osteoarthritis 1998  . 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 Burt Knack and Dr. Cyndia Bent.   . Sleep apnea   . Tinnitus aurium   . Venous stasis     Past Surgical History:  Procedure Laterality Date  . ANAL FISSURE REPAIR    .  FRACTURE SURGERY    . INTRAOPERATIVE TRANSTHORACIC ECHOCARDIOGRAM Left 06/27/2020   Procedure: INTRAOPERATIVE TRANSTHORACIC ECHOCARDIOGRAM;  Surgeon: Sherren Mocha, MD;  Location: [place];  Service: Open Heart Surgery;  Laterality: Left;  . RIGHT/LEFT HEART CATH AND CORONARY ANGIOGRAPHY N/A 05/25/2020   Procedure: RIGHT/LEFT HEART CATH AND CORONARY ANGIOGRAPHY;  Surgeon: Sherren Mocha, MD;  Location: [place] [facility];  Service: Cardiovascular;  Laterality: N/A;  . ROTATOR CUFF REPAIR Right   . TRANSCATHETER AORTIC VALVE REPLACEMENT, TRANSFEMORAL N/A 06/27/2020   Procedure: TRANSCATHETER AORTIC VALVE REPLACEMENT,  TRANSFEMORAL;  Surgeon: Sherren Mocha, MD;  Location: [place];  Service: Open Heart Surgery;  Laterality: N/A;  . ULTRASOUND GUIDANCE FOR VASCULAR ACCESS Bilateral 06/27/2020   Procedure: ULTRASOUND GUIDANCE FOR VASCULAR ACCESS;  Surgeon: Sherren Mocha, MD;  Location: [place];  Service: Open Heart Surgery;  Laterality: Bilateral;    Current Medications: Current Meds  Medication Sig  . amLODipine (NORVASC) 10 MG tablet Take 0.5 tablets (5 mg total) by mouth daily.  Marland Kitchen amoxicillin (AMOXIL) 500 MG tablet Take 4 tablets (2,000 mg total) one hour prior to all dental visits.  Marland Kitchen aspirin EC 81 MG tablet Take 1 tablet (81 mg total) by mouth daily. Swallow whole.  Marland Kitchen atorvastatin (LIPITOR) 20 MG tablet Take 20 mg by mouth at bedtime.  Marland Kitchen buPROPion (WELLBUTRIN SR) 150 MG 12 hr tablet Take 150 mg by mouth 2 (two) times daily.  . carvedilol (COREG) 12.5 MG tablet Take 1 tablet (12.5 mg total) by mouth 2 (two) times daily.  . Cod Liver Oil OIL Take 1 capsule by mouth daily. 375 mg vitamin A 415 mg cod liver oil  . diazepam (VALIUM) 10 MG tablet Take 10 mg by mouth daily as needed for anxiety.  . diclofenac Sodium (VOLTAREN) 1 % GEL Apply 2 g topically daily as needed (pain).  . DULoxetine (CYMBALTA) 60 MG capsule Take 60 mg by mouth daily.  . empagliflozin (JARDIANCE) 10 MG TABS tablet Take 1 tablet (10 mg total) by mouth daily before breakfast.  . eplerenone (INSPRA) 25 MG tablet Take 0.5 tablets (12.5 mg total) by mouth daily.  . meloxicam (MOBIC) 15 MG tablet Take 15 mg by mouth daily as needed for pain.  . Omega-3 Fatty Acids (FISH OIL) 1200 MG CPDR Take 1,200 mg by mouth daily.  Marland Kitchen OVER THE COUNTER MEDICATION Take 1 capsule by mouth daily. Go-Out  . oxyCODONE-acetaminophen (PERCOCET) 10-325 MG tablet Take 2 tablets by mouth 3 (three) times daily as needed for pain.  . sacubitril-valsartan (ENTRESTO) 49-51 MG Take 1 tablet by mouth 2 (two) times daily.  . Semaglutide (RYBELSUS) 3 MG TABS Take 3 mg by mouth  daily before breakfast.  . tamsulosin (FLOMAX) 0.4 MG CAPS capsule Take 0.4 mg by mouth daily.  . [DISCONTINUED] clopidogrel (PLAVIX) 75 MG tablet Take 1 tablet (75 mg total) by mouth daily with breakfast.     Allergies:   Patient has no known allergies.   Social History   Socioeconomic History  . Marital status: Married    Spouse name: Not on file  . Number of children: Not on file  . Years of education: Not on file  . Highest education level: Not on file  Occupational History  . Not on file  Tobacco Use  . Smoking status: Never Smoker  . Smokeless tobacco: Never Used  Vaping Use  . Vaping Use: Never used  Substance and Sexual Activity  . Alcohol use: Yes    Comment: occasionally  .  Drug use: No  . Sexual activity: Not on file  Other Topics Concern  . Not on file  Social History Narrative  . Not on file   Social Determinants of Health   Financial Resource Strain: Not on file  Food Insecurity: Not on file  Transportation Needs: Not on file  Physical Activity: Not on file  Stress: Not on file  Social Connections: Not on file     Family History: The patient's \\family  history includes Aneurysm in his mother; Cancer in his father and mother.  ROS:   Please see the history of present illness.    All other systems reviewed and are negative.  EKGs/Labs/Other Studies Reviewed:    The following studies were reviewed today:    TAVR OPERATIVE NOTE   Date of Procedure:06/27/2020  Preoperative Diagnosis:Severe Aortic Stenosis   Postoperative Diagnosis:Same   Procedure:   Transcatheter Aortic Valve Replacement - Percutaneous Transfemoral Approach Edwards Sapien 3 THV (size 69mm, model # 9600TFX, serial # [id number])  Co-Surgeons:Bryan Alveria Apley, MD and Sherren Mocha, MD  Anesthesiologist:David Linna Caprice, MD  Echocardiographer:Peter Johnsie Cancel,  MD  Pre-operative Echo Findings: ? Severe aortic stenosis ? Normalleft ventricular systolic function  Post-operative Echo Findings: ? Trivialparavalvular leak ? unchangedleft ventricular systolic function  _____________    Echo 06/28/20:  IMPRESSIONS 1. Diffuse hyokinesis worse in the inferior wall abnormal septal motion.  Left ventricular ejection fraction, by estimation, is 35 to 40%. The left  ventricle has moderately decreased function. The left ventricle has no  regional wall motion abnormalities.  The left ventricular internal cavity size was moderately dilated. Left  ventricular diastolic parameters were normal.  2. Right ventricular systolic function is normal. The right ventricular  size is normal.  3. The mitral valve is normal in structure. No evidence of mitral valve  regurgitation. No evidence of mitral stenosis.  4. Post TAVR with 29 mm Sapien 3 valve no significant PVL mean gradient  20 mmHg peak 35 mmHg DVI 0.37 and AVR 1.3 cm2 Gradients have increased  since implant 06/27/20. The aortic valve has been repaired/replaced. Aortic  valve regurgitation is not visualized. No aortic stenosis is present. Procedure Date: 06/27/2020.  5. The inferior vena cava is normal in size with greater than 50%  respiratory variability, suggesting right atrial pressure of 3 mmHg.   ____________________   Echo 07/27/20 IMPRESSIONS 1. Left ventricular ejection fraction, by estimation, is 40 to 45%. The  left ventricle has mildly decreased function. The left ventricle  demonstrates global hypokinesis. There is mild left ventricular  hypertrophy. Left ventricular diastolic parameters  are consistent with Grade I diastolic dysfunction (impaired relaxation).  There is abnormal septal motion.  2. Right ventricular systolic function is normal. The right ventricular  size is normal. Tricuspid regurgitation signal is inadequate for assessing  PA pressure.  3. Left atrial size  was mild to moderately dilated.  4. The mitral valve is grossly normal. Trivial mitral valve  regurgitation. No evidence of mitral stenosis.  5. The aortic valve has been repaired/replaced. Aortic valve  regurgitation is trivial. There is a 29 mm Sapien prosthetic (TAVR) valve  present in the aortic position. Procedure Date: 06/27/2020. Echo findings  are consistent with normal structure and  function of the aortic valve prosthesis. Aortic valve area, by VTI  measures 1.63 cm. Aortic valve mean gradient measures 16.5 mmHg. Aortic  valve Vmax measures 2.70 m/s.  6. The inferior vena cava is normal in size with greater than 50%  respiratory variability, suggesting right  atrial pressure of 3 mmHg.   Comparison(s): Prior images reviewed side by side. Changes from prior  study are noted. LVEF appears slightly improved compared to prior, LVOT  color flow appears similar.   Conclusion(s)/Recommendation(s): Technically challenging, recommend echo  contrast for future studies. Grossly unchanged from prior.   EKG:  EKG is NOT ordered today.   Recent Labs: 04/10/2020: BNP 99.7; TSH 1.870 06/23/2020: ALT 11 06/28/2020: BUN 19; Creatinine, Ser 1.21; Hemoglobin 11.0; Magnesium 1.8; Platelets 175; Potassium 3.8; Sodium 138  Recent Lipid Panel    Component Value Date/Time   CHOL 170 04/10/2020 1558   TRIG 182 (H) 04/10/2020 1558   HDL 43 04/10/2020 1558   CHOLHDL 4.0 04/10/2020 1558   LDLCALC 96 04/10/2020 1558     Risk Assessment/Calculations:       Physical Exam:    VS:  BP 130/80 (BP Location: Left Wrist, Patient Position: Sitting, Cuff Size: Large)   Pulse 76   Ht 6\' 1"  (1.854 m)   Wt (!) 404 lb 12.8 oz (183.6 kg)   SpO2 96%   BMI 53.41 kg/m     Wt Readings from Last 3 Encounters:  07/27/20 (!) 404 lb 12.8 oz (183.6 kg)  07/05/20 (!) 402 lb (182.3 kg)  06/27/20 (!) 402 lb 12.5 oz (182.7 kg)     GEN: Well nourished, well developed in no acute distress, morbidly obese HEENT:  Normal NECK: No JVD LYMPHATICS: No lymphadenopathy CARDIAC: RRR, no murmurs, rubs, gallops RESPIRATORY:  Clear to auscultation without rales, wheezing or rhonchi  ABDOMEN: Soft, non-tender, non-distended MUSCULOSKELETAL:  No edema; No deformity  SKIN: Warm and dry.  Groin sites clear without hematoma or ecchymosis  NEUROLOGIC:  Alert and oriented x 3 PSYCHIATRIC:  Normal affect   ASSESSMENT:    1. S/P TAVR (transcatheter aortic valve replacement)   2. Hypertension, unspecified type   3. Chronic combined systolic (congestive) and diastolic (congestive) heart failure ([place])   4. Morbid obesity ([place])    PLAN:    In order of problems listed above:  Severe AS s/p TAVR: echo today shows EF 40-45%, normally functioning TAVR with a mean gradient of 16.5 mm hg and no PVL. He has NYHA class II symptoms; but has had a big improvement since TAVR. SBE prophylaxis discussed; he has amoxicillin. Continue aspirin and plavix. He can discontinue Plavix after 6 month of therapy (12/2020.)  I will see him back in 1 year with an echo.   HTN: Bp well controlled today.  Bp treated in the context of CHF treatment   Chronic combined S/D CHF: appears euvolemic. Continue GDMT with Entresto, Coreg, Jardiance, eplerenone.   Morbid obesity: Body mass index is 53.14 kg/m. Counseled on importance of diet and exercise.   Medication Adjustments/Labs and Tests Ordered: Current medicines are reviewed at length with the patient today.  Concerns regarding medicines are outlined above.  No orders of the defined types were placed in this encounter.  Meds ordered this encounter  Medications  . clopidogrel (PLAVIX) 75 MG tablet    Sig: Take 1 tablet (75 mg total) by mouth daily with breakfast.    Dispense:  90 tablet    Refill:  1    No refill needed.  Pt will stop medication at 6 months post valve surgery. (12/27/20)    Patient Instructions  Medication Instructions:  Stop Plavix after you finish your second  bottle (around 12/27/20)  *If you need a refill on your cardiac medications before your next appointment, please  call your pharmacy*   Lab Work: none If you have labs (blood work) drawn today and your tests are completely normal, you will receive your results only by: Marland Kitchen MyChart Message (if you have MyChart) OR . A paper copy in the mail If you have any lab test that is abnormal or we need to change your treatment, we will call you to review the results.   Testing/Procedures: none   Follow-Up: At [facility], you and your health needs are our priority.  As part of our continuing mission to provide you with exceptional heart care, we have created designated Provider Care Teams.  These Care Teams include your primary Cardiologist (physician) and Advanced Practice Providers (APPs -  Physician Assistants and Nurse Practitioners) who all work together to provide you with the care you need, when you need it.  We recommend signing up for the patient portal called "MyChart".  Sign up information is provided on this After Visit Summary.  MyChart is used to connect with patients for Virtual Visits (Telemedicine).  Patients are able to view lab/test results, encounter notes, upcoming appointments, etc.  Non-urgent messages can be sent to your provider as well.   To learn more about what you can do with MyChart, go to [email or web address].    Your next appointment:   4-5 month(s)  The format for your next appointment:   In Person  Provider:   You may see Donato Heinz, MD or one of the following Advanced Practice Providers on your designated Care Team:    Rosaria Ferries, PA-C  Jory Sims, DNP, ANP    Other Instructions      Signed, Angelena Form, PA-C  07/28/2020 8:32 AM    [place]

## 2020-08-30 ENCOUNTER — Other Ambulatory Visit: Payer: Self-pay | Admitting: Cardiology

## 2020-12-27 ENCOUNTER — Other Ambulatory Visit: Payer: Self-pay | Admitting: Cardiology

## 2021-01-16 ENCOUNTER — Telehealth: Payer: Self-pay

## 2021-01-16 NOTE — Telephone Encounter (Signed)
Letter has been sent to patient informing them that their sleep study has expired. Patient will need to call and schedule an office visit to re-evaluate the need for a sleep study.    

## 2021-04-16 ENCOUNTER — Other Ambulatory Visit: Payer: Self-pay | Admitting: Physician Assistant

## 2021-04-16 DIAGNOSIS — Z952 Presence of prosthetic heart valve: Secondary | ICD-10-CM

## 2021-04-20 ENCOUNTER — Other Ambulatory Visit: Payer: Self-pay | Admitting: Cardiology

## 2021-06-26 NOTE — Progress Notes (Addendum)
?[facility]   ?[place] ?                                    ?Cardiology Office Note:   ? ?Date:  06/29/2021  ? ?ID:  DELLIS VOGHT, DOB [birth year], MRN [id number] ? ?PCP:  Candi Leash, PA-C  ?[facility] Cardiologist:  Donato Heinz, MD / Dr. Burt Knack & Dr. Cyndia Bent (TAVR) ?[place] Electrophysiologist:  None  ? ?Referring MD: Candi Leash, PA-C  ? ?1 year s/p TAVR ? ?History of Present Illness:   ? ?Dakota Meyer is a 66 y.o. 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 ? ?Echocardiogram in September 2021 showed moderate calcification of the aortic valve with a mean gradient of 26 mmHg and a peak gradient 43 mmHg.  Dimensionless index was 0.29.  Left ventricular ejection fraction was 35 to 40% with global hypokinesis and grade 1 diastolic dysfunction. Repeat echocardiogram on 05/16/2020 showed an increase in the mean gradient to 36 mmHg with a peak gradient of 61.5 mmHg.  Dimensionless index of 0.33.  Left ventricular ejection fraction is 30 to 35% with grade 1 diastolic dysfunction.  He subsequently underwent cardiac catheterization on 05/25/2020 which showed angiographically normal coronary arteries. Right heart pressures were normal with high cardiac output.  The peak to peak gradient across aortic valve was 52 mmHg with a mean gradient of 37 mmHg.  Aortic valve area was measured at 1.7 cm?. ?  ?He was evaluated by the multidisciplinary valve team and underwent successful TAVR with a 29 mm Edwards Sapien 3 THV via the TF approach on 06/27/20. Post operative echo showed EF 35-40%, normally functioning TAVR with a mean gradient of 20 mmHg (likely elevated due to patient prosthesis mismatch due to body habitus) and no PVL. He was continued on Asprin and started on Plavix  75 mg daily. He did complain of orthostasis symptoms and his HCTZ was discontinued. He was resumed on all other CHF meds. He had significant improvement since TAVR. 1 month echo showed EF 40-45%, normally functioning TAVR with a mean gradient of 16.5 mm hg and no PVL. ? ?Today he presents to clinic for follow up. He is doing relatively well. Dealing with the loss of his father in law. Put is autistic daughter in a home a few years ago which has helped relive some stress. He continues to have significant DOE with just walking around the house. Chronic mild LE edema, but no orthopnea or PND. No dizziness or syncope. No blood in stool or urine. No palpitations. He has advanced knee OA but they wont consider him for surgery until he loses weight. He cant lose weight without being able to exercise.  ?  ? ?Past Medical History:  ?Diagnosis Date  ? Anxiety   ? Chronic knee pain   ? Depression   ? Hypertension   ? Leukemia ([place]) 1997  ? Morbid obesity ([place])   ? Osteoarthritis 1998  ? 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 s/p TAVR with a 29 mm Edwards Sapien 3 via the TF approach by Dr. Burt Knack and Dr. Cyndia Bent.   ? Sleep apnea   ? Tinnitus aurium   ? Venous stasis   ? ? ?  Past Surgical History:  ?Procedure Laterality Date  ? ANAL FISSURE REPAIR    ? FRACTURE SURGERY    ? INTRAOPERATIVE TRANSTHORACIC ECHOCARDIOGRAM Left 06/27/2020  ? Procedure: INTRAOPERATIVE TRANSTHORACIC ECHOCARDIOGRAM;  Surgeon: Sherren Mocha, MD;  Location: [facility];  Service: Open Heart Surgery;  Laterality: Left;  ? RIGHT/LEFT HEART CATH AND CORONARY ANGIOGRAPHY N/A 05/25/2020  ? Procedure: RIGHT/LEFT HEART CATH AND CORONARY ANGIOGRAPHY;  Surgeon: Sherren Mocha, MD;  Location: [facility];  Service: Cardiovascular;  Laterality: N/A;  ? ROTATOR CUFF REPAIR Right   ? TRANSCATHETER AORTIC VALVE REPLACEMENT, TRANSFEMORAL N/A 06/27/2020  ? Procedure: TRANSCATHETER AORTIC VALVE REPLACEMENT, TRANSFEMORAL;  Surgeon: Sherren Mocha,  MD;  Location: [facility];  Service: Open Heart Surgery;  Laterality: N/A;  ? ULTRASOUND GUIDANCE FOR VASCULAR ACCESS Bilateral 06/27/2020  ? Procedure: ULTRASOUND GUIDANCE FOR VASCULAR ACCESS;  Surgeon: Sherren Mocha, MD;  Location: [facility];  Service: Open Heart Surgery;  Laterality: Bilateral;  ? ? ?Current Medications: ?Current Meds  ?Medication Sig  ? amLODipine (NORVASC) 10 MG tablet Take 0.5 tablets (5 mg total) by mouth daily.  ? amoxicillin (AMOXIL) 500 MG tablet Take 4 tablets (2,000 mg total) one hour prior to all dental visits.  ? atorvastatin (LIPITOR) 20 MG tablet Take 20 mg by mouth at bedtime.  ? buPROPion (WELLBUTRIN SR) 150 MG 12 hr tablet Take 150 mg by mouth 2 (two) times daily.  ? carvedilol (COREG) 12.5 MG tablet Take 1 tablet (12.5 mg total) by mouth 2 (two) times daily.  ? cloNIDine (CATAPRES) 0.1 MG tablet in the morning and at bedtime.  ? Cod Liver Oil OIL Take 1 capsule by mouth daily. 375 mg vitamin A ?415 mg cod liver oil  ? diazepam (VALIUM) 10 MG tablet Take 10 mg by mouth daily as needed for anxiety.  ? diclofenac Sodium (VOLTAREN) 1 % GEL Apply 2 g topically daily as needed (pain).  ? DULoxetine (CYMBALTA) 60 MG capsule Take 60 mg by mouth daily.  ? losartan (COZAAR) 100 MG tablet daily.  ? meloxicam (MOBIC) 15 MG tablet Take 15 mg by mouth daily as needed for pain.  ? Omega-3 Fatty Acids (FISH OIL) 1200 MG CPDR Take 1,200 mg by mouth daily.  ? OVER THE COUNTER MEDICATION Take 1 capsule by mouth daily. Go-Out  ? oxyCODONE-acetaminophen (PERCOCET) 10-325 MG tablet Take 2 tablets by mouth 3 (three) times daily as needed for pain.  ? sildenafil (REVATIO) 20 MG tablet as needed.  ? tamsulosin (FLOMAX) 0.4 MG CAPS capsule Take 0.4 mg by mouth daily.  ? testosterone cypionate (DEPOTESTOTERONE CYPIONATE) 100 MG/ML injection every 30 (thirty) days.  ? [DISCONTINUED] aspirin EC 81 MG tablet Take 1 tablet (81 mg total) by mouth daily. Swallow whole.  ? [DISCONTINUED] carvedilol (COREG) 6.25 MG tablet  TAKE 1 TABLET(6.25 MG) BY MOUTH TWICE DAILY  ? [DISCONTINUED] clopidogrel (PLAVIX) 75 MG tablet Take 75 mg by mouth daily.  ? [DISCONTINUED] empagliflozin (JARDIANCE) 10 MG TABS tablet Take 1 tablet (10 mg total) by mouth daily before breakfast.  ? [DISCONTINUED] ENTRESTO 49-51 MG TAKE 1 TABLET BY MOUTH TWICE DAILY  ? [DISCONTINUED] eplerenone (INSPRA) 25 MG tablet Take 0.5 tablets (12.5 mg total) by mouth daily.  ? [DISCONTINUED] Semaglutide (RYBELSUS) 3 MG TABS Take 3 mg by mouth daily before breakfast.  ?  ? ?Allergies:   Patient has no known allergies.  ? ?Social History  ? ?Socioeconomic History  ? Marital status: Married  ?  Spouse name: Not on file  ?  Number of children: Not on file  ? Years of education: Not on file  ? Highest education level: Not on file  ?Occupational History  ? Not on file  ?Tobacco Use  ? Smoking status: Never  ? Smokeless tobacco: Never  ?Vaping Use  ? Vaping Use: Never used  ?Substance and Sexual Activity  ? Alcohol use: Yes  ?  Comment: occasionally  ? Drug use: No  ? Sexual activity: Not on file  ?Other Topics Concern  ? Not on file  ?Social History Narrative  ? Not on file  ? ?Social Determinants of Health  ? ?Financial Resource Strain: Not on file  ?Food Insecurity: Not on file  ?Transportation Needs: Not on file  ?Physical Activity: Not on file  ?Stress: Not on file  ?Social Connections: Not on file  ?  ? ?Family History: ?The patient's' \\family'$  history includes Aneurysm in his mother; Cancer in his father and mother. ? ?ROS:   ?Please see the history of present illness.    ?All other systems reviewed and are negative. ? ?EKGs/Labs/Other Studies Reviewed:   ? ?The following studies were reviewed today: ? ? ? ?TAVR OPERATIVE NOTE ?  ?  ?Date of Procedure:                06/27/2020 ?  ?Preoperative Diagnosis:      Severe Aortic Stenosis  ?  ?Postoperative Diagnosis:    Same  ?  ?Procedure:      ?  ?Transcatheter Aortic Valve Replacement - Percutaneous  Transfemoral Approach ?             Edwards Sapien 3 THV (size 29 mm, model # 9600TFX, serial # [id number]) ?             ?Co-Surgeons:                        Gaye Pollack, MD and Sherren Mocha, MD ?  ?Anesthesiologist:

## 2021-06-29 ENCOUNTER — Ambulatory Visit: Payer: Medicare Other | Admitting: Physician Assistant

## 2021-06-29 ENCOUNTER — Ambulatory Visit (HOSPITAL_COMMUNITY): Payer: Medicare Other | Attending: Cardiology

## 2021-06-29 ENCOUNTER — Ambulatory Visit: Payer: Medicare Other

## 2021-06-29 VITALS — BP 130/77 | HR 80 | Ht 73.0 in | Wt >= 6400 oz

## 2021-06-29 DIAGNOSIS — Z952 Presence of prosthetic heart valve: Secondary | ICD-10-CM | POA: Diagnosis present

## 2021-06-29 DIAGNOSIS — I5042 Chronic combined systolic (congestive) and diastolic (congestive) heart failure: Secondary | ICD-10-CM | POA: Diagnosis not present

## 2021-06-29 DIAGNOSIS — I10 Essential (primary) hypertension: Secondary | ICD-10-CM

## 2021-06-29 LAB — ECHOCARDIOGRAM COMPLETE
AR max vel: 1.59 cm2
AV Area VTI: 1.68 cm2
AV Area mean vel: 1.51 cm2
AV Mean grad: 20 mmHg
AV Peak grad: 35.5 mmHg
Ao pk vel: 2.98 m/s
Area-P 1/2: 3.66 cm2
P 1/2 time: 467 msec
S' Lateral: 3.4 cm

## 2021-06-29 MED ORDER — ASPIRIN EC 81 MG PO TBEC: 81.0000 mg | DELAYED_RELEASE_TABLET | Freq: Every day | ORAL | 3 refills | Status: AC

## 2021-06-29 NOTE — Patient Instructions (Addendum)
Medication Instructions:  ?Your physician recommends that you continue on your current medications as directed. Please refer to the Current Medication list given to you today. ? ?*If you need a refill on your cardiac medications before your next appointment, please call your pharmacy* ? ?Lab Work: ?If you have labs (blood work) drawn today and your tests are completely normal, you will receive your results only by: ?MyChart Message (if you have MyChart) OR ?A paper copy in the mail ?If you have any lab test that is abnormal or we need to change your treatment, we will call you to review the results. ? ?Testing/Procedures: ?None ordered today. ? ?Follow-Up: ?At [facility], you and your health needs are our priority.  As part of our continuing mission to provide you with exceptional heart care, we have created designated Provider Care Teams.  These Care Teams include your primary Cardiologist (physician) and Advanced Practice Providers (APPs -  Physician Assistants and Nurse Practitioners) who all work together to provide you with the care you need, when you need it. ? ?We recommend signing up for the patient portal called "MyChart".  Sign up information is provided on this After Visit Summary.  MyChart is used to connect with patients for Virtual Visits (Telemedicine).  Patients are able to view lab/test results, encounter notes, upcoming appointments, etc.  Non-urgent messages can be sent to your provider as well.   ?To learn more about what you can do with MyChart, go to [email or web address].   ? ?Your next appointment:   ?4 month(s) ? ?The format for your next appointment:   ?In Person ? ?Provider:   ?Donato Heinz, MD { ? ? ?Important Information About Sugar ? ? ? ? ?  ?

## 2021-06-29 NOTE — Addendum Note (Signed)
Addended by: Angelena Form R on: 06/29/2021 03:09 PM ? ? Modules accepted: Orders ? ?

## 2021-07-19 ENCOUNTER — Ambulatory Visit: Payer: Medicare Other | Admitting: Pharmacist

## 2021-07-19 DIAGNOSIS — Z6841 Body Mass Index (BMI) 40.0 and over, adult: Secondary | ICD-10-CM | POA: Diagnosis not present

## 2021-07-19 MED ORDER — OZEMPIC (0.25 OR 0.5 MG/DOSE) 2 MG/3ML ~~LOC~~ SOPN: 0.5000 mg | PEN_INJECTOR | SUBCUTANEOUS | 1 refills | Status: DC

## 2021-07-19 NOTE — Patient Instructions (Addendum)
Try to stop drinking diet soda Grilled chicken, grain bowls with quina, bean soups, plain yogurt w/ nuts and fruit, salads w/ oil and vinegar Keep alcohol to a minimum.  Decrease red meat consumption  GLP-1 Receptor Agonist Counseling Points This medication reduces your appetite and may make you feel fuller longer.  Stop eating when your body tells you that you are full. This will likely happen sooner than you are used to. Store your medication in the fridge until you are ready to use it. Inject your medication in the fatty tissue of your lower abdominal area (2 inches away from belly button) or upper outer thigh. Rotate injection sites. Each pen will last you about 1 month (the first month it will last a few weeks longer). Use a different needle with each weekly injection. Common side effects include: nausea, diarrhea/constipation, and heartburn, and are more likely to occur if you overeat.  Dosing schedule: - Month 1: Inject 0.'25mg'$  subcutaneously once weekly for 4 weeks - Month 2: Inject 0.'5mg'$  subcutaneously once weekly for 4 weeks - Month 3: Inject '1mg'$  subcutaneously once weekly for 4 weeks - Month 4: Inject '2mg'$  subcutaneously once weekly  Tips for living a healthier life     Building a Healthy and Balanced Diet Make most of your meal vegetables and fruits -  of your plate. Aim for color and variety, and remember that potatoes don't count as vegetables on the Healthy Eating Plate because of their negative impact on blood sugar.  Go for whole grains -  of your plate. Whole and intact grains--whole wheat, barley, wheat berries, quinoa, oats, brown rice, and foods made with them, such as whole wheat pasta--have a milder effect on blood sugar and insulin than white bread, white rice, and other refined grains.  Protein power -  of your plate. Fish, poultry, beans, and nuts are all healthy, versatile protein sources--they can be mixed into salads, and pair well with vegetables on a  plate. Limit red meat, and avoid processed meats such as bacon and sausage.  Healthy plant oils - in moderation. Choose healthy vegetable oils like olive, canola, soy, corn, sunflower, peanut, and others, and avoid partially hydrogenated oils, which contain unhealthy trans fats. Remember that low-fat does not mean "healthy."  Drink water, coffee, or tea. Skip sugary drinks, limit milk and dairy products to one to two servings per day, and limit juice to a small glass per day.  Stay active. The red figure running across the [place] is a reminder that staying active is also important in weight control.  The main message of the Healthy Eating Plate is to focus on diet quality:  The type of carbohydrate in the diet is more important than the amount of carbohydrate in the diet, because some sources of carbohydrate--like vegetables (other than potatoes), fruits, whole grains, and beans--are healthier than others. The Healthy Eating Plate also advises consumers to avoid sugary beverages, a major source of calories--usually with little nutritional value--in the American diet. The Healthy Eating Plate encourages consumers to use healthy oils, and it does not set a maximum on the percentage of calories people should get each day from healthy sources of fat. In this way, the Healthy Eating Plate recommends the opposite of the low-fat message promoted for decades by the USDA.  [email or web address].no  SUGAR  Sugar is a huge problem in the modern day diet. Sugar is a big contributor to heart disease, diabetes, high triglyceride levels, fatty liver disease and obesity.  Sugar is hidden in almost all packaged foods/beverages. Added sugar is extra sugar that is added beyond what is naturally found and has no nutritional benefit for your body. The American Heart Association recommends limiting added sugars to no more than 25g for women and 36  grams for men per day. There are many names for sugar including maltose, sucrose (names ending in "ose"), high fructose corn syrup, molasses, cane sugar, corn sweetener, raw sugar, syrup, honey or fruit juice concentrate.   One of the best ways to limit your added sugars is to stop drinking sweetened beverages such as soda, sweet tea, and fruit juice.  There is 65g of added sugars in one 20oz bottle of Coke! That is equal to 7.5 donuts.   Pay attention and read all nutrition facts labels. Below is an examples of a nutrition facts label. The #1 is showing you the total sugars where the # 2 is showing you the added sugars. This one serving has almost the max amount of added sugars per day!     20 oz Soda 65g Sugar = 7.5 Glazed Donuts  16oz Energy  Drink 54g Sugar = 6.5 Glazed Donuts  Large Sweet  Tea 38g Sugar = 4 Glazed Donuts  20oz Sports  Drink 34g Sugar = 3.5 Glazed Donuts  8oz Chocolate Milk 24g Sugar =2.5 Glazed Donuts  8oz Orange  Juice 21g Sugar = 2 Glazed Donuts  1 Juice Box 14g Sugar = 1.5 Glazed Donuts  16oz Water= NO SUGAR!!  EXERCISE  Exercise is good. We've all heard that. In an ideal world, we would all have time and resources to get plenty of it. When you are active, your heart pumps more efficiently and you will feel better.  Multiple studies show that even walking regularly has benefits that include living a longer life. The American Heart Association recommends 150 minutes per week of exercise (30 minutes per day most days of the week). You can do this in any increment you wish. Nine or more 10-minute walks count. So does an hour-long exercise class. Break the time apart into what will work in your life. Some of the best things you can do include walking briskly, jogging, cycling or swimming laps. Not everyone is ready to "exercise." Sometimes we need to start with just getting active. Here are some easy ways to be more active throughout the day:  Take the  stairs instead of the elevator  Go for a 10-15 minute walk during your lunch break (find a friend to make it more enjoyable)  When shopping, park at the back of the parking lot  If you take public transportation, get off one stop early and walk the extra distance  Pace around while making phone calls  Check with your doctor if you aren't sure what your limitations may be. Always remember to drink plenty of water when doing any type of exercise. Don't feel like a failure if you're not getting the 90-150 minutes per week. If you started by being a couch potato, then just a 10-minute walk each day is a huge improvement. Start with little victories and work your way up.   HEALTHY EATING TIPS  When looking to improve your eating habits, whether to lose weight, lower blood pressure or just be healthier, it helps to know what a serving size is.   Grains 1 slice of bread,  bagel,  cup pasta or rice  Vegetables 1 cup fresh or raw vegetables,  cup cooked or canned  Fruits 1 piece of medium sized fruit,  cup canned,   Meats/Proteins  cup dried       1 oz meat, 1 egg,  cup cooked beans, nuts or seeds  Dairy        Fats Individual yogurt container, 1 cup (8oz)    1 teaspoon margarine/butter or vegetable  milk or milk alternative, 1 slice of cheese          oil; 1 tablespoon mayonnaise or salad dressing                  Plan ahead: make a menu of the meals for a week then create a grocery list to go with that menu. Consider meals that easily stretch into a night of leftovers, such as stews or casseroles. Or consider making two of your favorite meal and put one in the freezer for another night. Try a night or two each week that is "meatless" or "no cook" such as salads. When you get home from the grocery store wash and prepare your vegetables and fruits. Then when you need them they are ready to go.   Tips for going to the grocery store:  [place] store or generic brands  Check the weekly ad from your  store on-line or in their in-store flyer  Look at the unit price on the shelf tag to compare/contrast the costs of different items  Buy fruits/vegetables in season  Carrots, bananas and apples are low-cost, naturally healthy items  If meats or frozen vegetables are on sale, buy some extras and put in your freezer  Limit buying prepared or "ready to eat" items, even if they are pre-made salads or fruit snacks  Do not shop when you're hungry  Foods at eye level tend to be more expensive. Look on the high and low shelves for deals.  Consider shopping at the farmer's market for fresh foods in season.  Avoid the cookie and chip aisles (these are expensive, high in calories and low in nutritional value). Shop on the outside of the grocery store.  Healthy food preparations:  If you can't get lean hamburger, be sure to drain the fat when cooking  Steam, saut (in olive oil), grill or bake foods  Experiment with different seasonings to avoid adding salt to your foods. Kosher salt, sea salt and Himalayan salt are all still salt and should be avoided. Try seasoning food with onion, garlic, thyme, rosemary, basil ect. Onion powder or garlic powder is ok. Avoid if it says salt (ie garlic salt).

## 2021-07-19 NOTE — Progress Notes (Signed)
Patient ID: Dakota Meyer                 DOB: [birth year]                    MRN: [id number]     HPI: Dakota Meyer is a 66 y.o. male patient referred to pharmacy clinic by Dakota Range, PA to initiate weight loss therapy with GLP1-RA. 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 Most recent BMI 55.81.  Patient presents today to clinic. He walks with a cane. Has an very hard time getting up out of the chair due to knees. He did not want to stop at scale for weight.  Patient needs to have knee surgery, but they wont do surgery until he loses weight which he is finding hard to do if he cant exercise.   Patient's mom had thyroid cancer. He does not know what type. His mom, father and step dad are deceased, therefore he cannot ask. Patient was educated about the risk of Dakota Meyer, Dakota Meyer. These are extremely rare but cannot rule out him mom did not have. Patient is aware of the risk of thyroid cancer and would still like to proceed. No hx of pancreatitis or gallstones.  Was previously drinking a lot of liquor but has cut back a lot.  Drinks diet sodas and a decent amount of red meat.    Current weight management medications: none  Previously tried meds: none  Current meds that may affect weight:  Baseline weight/BMI: 421/55.81  Insurance payor: [employer] medicare  Diet:  -Breakfast:Rasin brain with milk if he eats breakfast, occasionally an omelette (cheese, onions, salsa) -Lunch: burger, small sub -Dinner: steak, sausage, chicken side of veggies or white rice -Snacks: cashews or pecans  -Drinks: diet mt dew, water, red bull, coke zero  Exercise: none due to arthritis, leg lifts, arm curls  Family History:  Family History  Problem Relation Age of Onset   Cancer Mother        thyroid ca   Aneurysm Mother    Cancer Father        lung     Social History: was drinking whiskey fireball (has cut way back) cocktail every other night (2oz)  Labs: Lab Results  Component Value Date   HGBA1C 5.0 06/23/2020    Wt Readings from Last 1 Encounters:  06/29/21 (!) 423 lb (191.9 kg)    BP Readings from Last 1 Encounters:  06/29/21 130/77   Pulse Readings from Last 1 Encounters:  06/29/21 80       Component Value Date/Time   CHOL 170 04/10/2020 1558   TRIG 182 (H) 04/10/2020 1558   HDL 43 04/10/2020 1558   CHOLHDL 4.0 04/10/2020 1558   [place] 96 04/10/2020 1558    Past Medical History:  Diagnosis Date   Anxiety    Chronic knee pain    Depression    Hypertension    Leukemia ([place]) 1997   Morbid obesity ([place])    Osteoarthritis 1998   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 Burt Knack and Dr. Cyndia Bent.    Sleep apnea    Tinnitus aurium    Venous stasis     Current Outpatient Medications on File Prior to Visit  Medication Sig Dispense Refill   amLODipine (NORVASC) 10 MG tablet Take 0.5 tablets (5 mg total) by mouth daily. 30 tablet 3   amoxicillin (AMOXIL) 500 MG tablet Take 4 tablets (2,000 mg total) one hour prior to all dental visits. 8 tablet 12   aspirin EC 81 MG tablet Take 1 tablet (81 mg total) by mouth daily. Swallow whole. 90 tablet 3   atorvastatin (LIPITOR) 20 MG tablet Take 20 mg by mouth at bedtime.     buPROPion (WELLBUTRIN SR) 150 MG 12 hr tablet Take 150 mg by mouth 2 (two) times daily.     carvedilol (COREG) 12.5 MG tablet Take 1 tablet (12.5 mg total) by mouth 2 (two) times daily. 180 tablet 1   cloNIDine (CATAPRES) 0.1 MG tablet in the morning and at bedtime.     Cod Liver Oil OIL Take 1 capsule by mouth daily. 375 mg vitamin A 415 mg cod liver oil     diazepam (VALIUM) 10 MG tablet Take 10 mg by mouth daily as needed for anxiety.     diclofenac Sodium (VOLTAREN) 1 % GEL Apply 2 g topically daily as needed  (pain).     DULoxetine (CYMBALTA) 60 MG capsule Take 60 mg by mouth daily.     losartan (COZAAR) 100 MG tablet daily.     meloxicam (MOBIC) 15 MG tablet Take 15 mg by mouth daily as needed for pain.     Omega-3 Fatty Acids (FISH OIL) 1200 MG CPDR Take 1,200 mg by mouth daily.     OVER THE COUNTER MEDICATION Take 1 capsule by mouth daily. Go-Out     oxyCODONE-acetaminophen (PERCOCET) 10-325 MG tablet Take 2 tablets by mouth 3 (three) times daily as needed for pain.     sildenafil (REVATIO) 20 MG tablet as needed.     tamsulosin (FLOMAX) 0.4 MG CAPS capsule Take 0.4 mg by mouth daily.     testosterone cypionate (DEPOTESTOTERONE CYPIONATE) 100 MG/ML injection every 30 (thirty) days.     No current facility-administered medications on file prior to visit.    No Known Allergies   Assessment/Plan:  1. Weight loss - Patient has not met goal of at least 5% of body weight loss with comprehensive lifestyle modifications alone in the past 3-6 months. Pharmacotherapy is appropriate to pursue as augmentation. Will start Ozempic 0.29m weekly. Patient is aware that this is off label therapy since he is not diabetic and his insurance could stop coverage at any time.  Advised patient on common side effects including nausea, diarrhea, dyspepsia, decreased appetite, and fatigue. Counseled patient on reducing meal size and how to titrate medication to minimize side effects. Counseled patient to call if intolerable side effects or if experiencing dehydration, abdominal pain, or dizziness.   Diet reviewed in detail with patient. We discussed alternatives to take our burgers and subs for lunch. Grilled chicken, grain bowls with quina, bean soups, plain yogurt w/ nuts and fruit, salads w/ oil and vinegar. We talked about keeping alcohol intake to a minimum and focusing on drinking more water and less sodas.  Injection technique reviewed at today's visit. Rx sent to pharmacy.  Titration Plan:  Will plan to  follow the titration plan as below, pending patient is tolerating each dose before increasing to the next. Can slow titration if needed for tolerability.    - Month 1: Inject 0.263msubcutaneously once weekly for 4 weeks - Month 2: Inject 0.72m56mubcutaneously once weekly for 4 weeks - Month 3: Inject  29m subcutaneously once weekly for 4 weeks - Month 4: Inject 249msubcutaneously once weekly  Follow up in 1 month via telephone, 6 months in person.  Thank you,  MeRamond DialPharm.D, BCPS, CPP [id number]. [address] [address]Phone: [phone]: [phone]

## 2021-08-09 ENCOUNTER — Telehealth: Payer: Self-pay | Admitting: Pharmacist

## 2021-08-09 NOTE — Telephone Encounter (Signed)
Spoke with patient to see how he is doing on Ozempic. He has given 2 injections. Doing well. Some nausea in the AM that last about 5-10 min. Eating about 1/4 less than he was. Patient aware to increase to 0.'5mg'$  after his 4th injection. Will follow up in 4 weeks.

## 2021-08-16 ENCOUNTER — Other Ambulatory Visit: Payer: Self-pay | Admitting: Physician Assistant

## 2021-09-11 NOTE — Telephone Encounter (Signed)
Called pt to see how he was doing on Ozempic 0.'5mg'$  weekly. LVM for pt to call back.

## 2021-09-17 MED ORDER — SEMAGLUTIDE (1 MG/DOSE) 4 MG/3ML ~~LOC~~ SOPN: 1.0000 mg | PEN_INJECTOR | SUBCUTANEOUS | 0 refills | Status: DC

## 2021-09-17 NOTE — Addendum Note (Signed)
Addended by: Marcelle Overlie D on: 09/17/2021 04:16 PM   Modules accepted: Orders

## 2021-09-17 NOTE — Telephone Encounter (Addendum)
Called pt, doing well on ozempic. He did have 1 episode of severe stomach pain. Once he had a BM he felt a lot better.  Drinking unsweet tea and water. Doesn't drink enough water. Does get nauseous in the AM. Not drinking much alcohol or sugar as medication "doesn't like it" Walking a little bit more because its a little easier for him to move.  Follow up in 3 weeks via telephone.

## 2021-09-25 ENCOUNTER — Telehealth: Payer: Self-pay | Admitting: *Deleted

## 2021-09-25 NOTE — Telephone Encounter (Signed)
   Pre-operative Risk Assessment    Patient Name: Dakota Meyer  DOB: Sep 19, 1955 MRN: [id number]      Request for Surgical Clearance    Procedure:   LEFT WRIST GANGLION EXCISION  Date of Surgery:  Clearance TBD                                 Surgeon:  DR. Edmonia Lynch  Surgeon's Group or Practice Name:  [facility] Phone number:  [phone] Fax number:  [phone] ATTN: KELLY HIGH   Type of Clearance Requested:   - Medical ; ASA    Type of Anesthesia:   CHOICE   Additional requests/questions:    [name]   09/25/2021, 6:15 PM

## 2021-09-26 NOTE — Telephone Encounter (Signed)
   Name: Dakota Meyer  DOB: 1955-05-23  MRN: [id number]  Primary Cardiologist: Donato Heinz, MD  Chart reviewed as part of pre-operative protocol coverage. Because of Dakota Meyer's past medical history and time since last visit, he will require a follow-up tele visit in order to better assess preoperative cardiovascular risk.  Pre-op covering staff: - Please schedule appointment and call patient to inform them. If patient already had an upcoming appointment within acceptable timeframe, please add "pre-op clearance" to the appointment notes so provider is aware. - Please contact requesting surgeon's office via preferred method (i.e, phone, fax) to inform them of need for appointment prior to surgery.  Okay to hold ASA x7 days prior to procedure.  Please resume when medically safe to do so.  Elgie Collard, PA-C  09/26/2021, 4:02 PM

## 2021-09-27 NOTE — Telephone Encounter (Signed)
1st attempt to reach pt regarding surgical clearance and the need for a tele visit, left a message for her to call back.

## 2021-09-28 ENCOUNTER — Telehealth: Payer: Self-pay | Admitting: *Deleted

## 2021-09-28 NOTE — Telephone Encounter (Signed)
Pt agreeable to plan of care for tele pre op appt 10/04/21 @ 2:40. Med rec and consent are done.

## 2021-09-28 NOTE — Telephone Encounter (Signed)
Pt agreeable to plan of care for tele pre op appt 10/04/21 @ 2:40. Med rec and consent are done.      Patient Consent for Virtual Visit        Dakota Meyer has provided verbal consent on 09/28/2021 for a virtual visit (video or telephone).   CONSENT FOR VIRTUAL VISIT FOR:  Dakota Meyer  By participating in this virtual visit I agree to the following:  I hereby voluntarily request, consent and authorize [facility] and its employed or contracted physicians, physician assistants, nurse practitioners or other licensed health care professionals (the Practitioner), to provide me with telemedicine health care services (the "Services") as deemed necessary by the treating Practitioner. I acknowledge and consent to receive the Services by the Practitioner via telemedicine. I understand that the telemedicine visit will involve communicating with the Practitioner through live audiovisual communication technology and the disclosure of certain medical information by electronic transmission. I acknowledge that I have been given the opportunity to request an in-person assessment or other available alternative prior to the telemedicine visit and am voluntarily participating in the telemedicine visit.  I understand that I have the right to withhold or withdraw my consent to the use of telemedicine in the course of my care at any time, without affecting my right to future care or treatment, and that the Practitioner or I may terminate the telemedicine visit at any time. I understand that I have the right to inspect all information obtained and/or recorded in the course of the telemedicine visit and may receive copies of available information for a reasonable fee.  I understand that some of the potential risks of receiving the Services via telemedicine include:  Delay or interruption in medical evaluation due to technological equipment failure or disruption; Information transmitted may not be sufficient (e.g.  poor resolution of images) to allow for appropriate medical decision making by the Practitioner; and/or  In rare instances, security protocols could fail, causing a breach of personal health information.  Furthermore, I acknowledge that it is my responsibility to provide information about my medical history, conditions and care that is complete and accurate to the best of my ability. I acknowledge that Practitioner's advice, recommendations, and/or decision may be based on factors not within their control, such as incomplete or inaccurate data provided by me or distortions of diagnostic images or specimens that may result from electronic transmissions. I understand that the practice of medicine is not an exact science and that Practitioner makes no warranties or guarantees regarding treatment outcomes. I acknowledge that a copy of this consent can be made available to me via my patient portal ([name]), or I can request a printed copy by calling the office of [facility].    I understand that my insurance will be billed for this visit.   I have read or had this consent read to me. I understand the contents of this consent, which adequately explains the benefits and risks of the Services being provided via telemedicine.  I have been provided ample opportunity to ask questions regarding this consent and the Services and have had my questions answered to my satisfaction. I give my informed consent for the services to be provided through the use of telemedicine in my medical care

## 2021-10-04 ENCOUNTER — Ambulatory Visit (INDEPENDENT_AMBULATORY_CARE_PROVIDER_SITE_OTHER): Payer: Medicare Other | Admitting: Physician Assistant

## 2021-10-04 DIAGNOSIS — Z01810 Encounter for preprocedural cardiovascular examination: Secondary | ICD-10-CM | POA: Diagnosis not present

## 2021-10-04 NOTE — Progress Notes (Signed)
Virtual Visit via Telephone Note   Because of Dakota Meyer's co-morbid illnesses, he is at least at moderate risk for complications without adequate follow up.  This format is felt to be most appropriate for this patient at this time.  The patient did not have access to video technology/had technical difficulties with video requiring transitioning to audio format only (telephone).  All issues noted in this document were discussed and addressed.  No physical exam could be performed with this format.  Please refer to the patient's chart for his consent to telehealth for [facility].  Evaluation Performed:  Preoperative cardiovascular risk assessment _____________   Date:  10/04/2021   Patient ID:  Dakota, Meyer 09-13-1955, MRN [id number] Patient Location:  Home Provider location:   Office  Primary Care Provider:  Candi Leash, PA-C Primary Cardiologist:  Donato Heinz, MD  Chief Complaint / Patient Profile   66 y.o. y/o male with a h/o morbid obesity, chronic combined combined CHF, leukemia, HTN, HLD, degenerative arthritis, depression and severe AS s/p TAVR (06/27/20) who is pending LEFT WRIST GANGLION EXCISION and presents today for telephonic preoperative cardiovascular risk assessment.  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 Burt Knack and Dr. Cyndia Bent.    Sleep apnea    Tinnitus aurium    Venous stasis    Past Surgical History:  Procedure Laterality Date   ANAL FISSURE REPAIR     FRACTURE SURGERY     INTRAOPERATIVE TRANSTHORACIC ECHOCARDIOGRAM Left 06/27/2020   Procedure: INTRAOPERATIVE TRANSTHORACIC ECHOCARDIOGRAM;  Surgeon: Sherren Mocha, MD;  Location: [place];  Service:  Open Heart Surgery;  Laterality: Left;   RIGHT/LEFT HEART CATH AND CORONARY ANGIOGRAPHY N/A 05/25/2020   Procedure: RIGHT/LEFT HEART CATH AND CORONARY ANGIOGRAPHY;  Surgeon: Sherren Mocha, MD;  Location: [place] [facility];  Service: Cardiovascular;  Laterality: N/A;   ROTATOR CUFF REPAIR Right    TRANSCATHETER AORTIC VALVE REPLACEMENT, TRANSFEMORAL N/A 06/27/2020   Procedure: TRANSCATHETER AORTIC VALVE REPLACEMENT, TRANSFEMORAL;  Surgeon: Sherren Mocha, MD;  Location: [place];  Service: Open Heart Surgery;  Laterality: N/A;   ULTRASOUND GUIDANCE FOR VASCULAR ACCESS Bilateral 06/27/2020   Procedure: ULTRASOUND GUIDANCE FOR VASCULAR ACCESS;  Surgeon: Sherren Mocha, MD;  Location: [place];  Service: Open Heart Surgery;  Laterality: Bilateral;    Allergies  No Known Allergies  History of Present Illness    Dakota Meyer is a 66 y.o. male who presents via audio/video conferencing for a telehealth visit today.  Pt was last seen in cardiology clinic on 06/29/21 by Nell Range PA-C.  At that time Dakota Meyer was doing well though with significant baseline dyspnea. Notably had normal cors prior to TAVR.  Last echo 06/5021 with EF 55-60%, G1 DD, mild AI. The patient is now pending procedure as outlined above. Since his last visit, he reports he has been stable. He has chronic exertional dyspnea dating back for years including prior to recent cath/TAVR which he reports if anything has been improving. No recent anginal chest pain. No syncope.   Home Medications    Prior to Admission medications   Medication Sig Start  Date End Date Taking? Authorizing Provider  amLODipine (NORVASC) 10 MG tablet Take 0.5 tablets (5 mg total) by mouth daily. 05/19/20   Donato Heinz, MD  amoxicillin (AMOXIL) 500 MG tablet Take 4 tablets (2,000 mg total) one hour prior to all dental visits. 07/05/20   Eileen Stanford, PA-C  aspirin EC 81 MG tablet Take 1 tablet (81 mg total) by mouth daily. Swallow whole.  06/29/21   Eileen Stanford, PA-C  atorvastatin (LIPITOR) 20 MG tablet Take 20 mg by mouth at bedtime. 02/03/20   [provider]  buPROPion (WELLBUTRIN SR) 150 MG 12 hr tablet Take 150 mg by mouth 2 (two) times daily. 05/16/20   [provider]  carvedilol (COREG) 12.5 MG tablet Take 1 tablet (12.5 mg total) by mouth 2 (two) times daily. 05/23/20   Donato Heinz, MD  cloNIDine (CATAPRES) 0.1 MG tablet in the morning and at bedtime.    [provider]  [facility] Liver Oil OIL Take 1 capsule by mouth daily. 375 mg vitamin A 415 mg cod liver oil    [provider]  diazepam (VALIUM) 10 MG tablet Take 10 mg by mouth daily as needed for anxiety.    [provider]  diclofenac Sodium (VOLTAREN) 1 % GEL Apply 2 g topically daily as needed (pain).    [provider]  DULoxetine (CYMBALTA) 60 MG capsule Take 60 mg by mouth daily. 05/05/20   [provider]  losartan (COZAAR) 100 MG tablet daily.    [provider]  meloxicam (MOBIC) 15 MG tablet Take 15 mg by mouth daily as needed for pain. 05/22/20   [provider]  Omega-3 Fatty Acids (FISH OIL) 1200 MG CPDR Take 1,200 mg by mouth daily.    [provider]  OVER THE COUNTER MEDICATION Take 1 capsule by mouth daily. Go-Out    [provider]  oxyCODONE-acetaminophen (PERCOCET) 10-325 MG tablet Take 2 tablets by mouth 3 (three) times daily as needed for pain.    [provider]  Semaglutide, 1 MG/DOSE, (OZEMPIC, 1 MG/DOSE,) 2 MG/1.5ML SOPN AS DIRECTED    [provider]  Semaglutide, 1 MG/DOSE, 4 MG/3ML SOPN Inject 1 mg into the skin once a week. Patient not taking: Reported on 09/28/2021 09/17/21   Eileen Stanford, PA-C  sildenafil (REVATIO) 20 MG tablet as needed. 12/28/14   [provider]  tamsulosin (FLOMAX) 0.4 MG CAPS capsule Take 0.4 mg by mouth daily. 12/27/12   [provider]  testosterone cypionate  (DEPOTESTOTERONE CYPIONATE) 100 MG/ML injection every 30 (thirty) days. 05/22/21   [provider]    Physical Exam    Vital Signs:  Dakota Meyer does not have vital signs available for review today.  Given telephonic nature of communication, physical exam is limited. AAOx3. NAD. Normal affect.  Speech and respirations are unlabored.  Accessory Clinical Findings    None  Assessment & Plan    1.  Preoperative Cardiovascular Risk Assessment: RCRI 0.9% indicating low CV risk. Given morbid obesity and medical history would likely classify him more in the moderate risk range. The patient affirms he has been doing well without any new cardiac symptoms. He has chronic exertional dyspnea which he states dates back to even before cath (which showed normal coronaries) and echo (which showed improved LVEF). Therefore, based on ACC/AHA guidelines, the patient would be at acceptable risk for the planned procedure without further cardiovascular testing. The patient was advised that if he  develops new symptoms prior to surgery to contact our office to arrange for a follow-up visit, and he verbalized understanding.  Per review with Nell Range PA-C per earlier phone notes, patient may hold aspirin for 7 days prior to surgery as requested.  A copy of this note will be routed to requesting surgeon.  Time:   Today, I have spent 5 minutes with the patient with telehealth technology discussing medical history, symptoms, and management plan.     Charlie Pitter, PA-C  10/04/2021, 2:38 PM

## 2021-10-05 NOTE — H&P (Signed)
PREOPERATIVE H&P  Chief Complaint: LEFT WRIST GANGLION CYST  HPI: Dakota Meyer is a 66 y.o. male who presents with a diagnosis of LEFT WRIST GANGLION CYST. Symptoms are rated as moderate to severe, and have been worsening.  This is significantly impairing activities of daily living.  He has elected for surgical management.   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 Burt Knack and Dr. Cyndia Bent.    Sleep apnea    Tinnitus aurium    Venous stasis    Past Surgical History:  Procedure Laterality Date   ANAL FISSURE REPAIR     FRACTURE SURGERY     INTRAOPERATIVE TRANSTHORACIC ECHOCARDIOGRAM Left 06/27/2020   Procedure: INTRAOPERATIVE TRANSTHORACIC ECHOCARDIOGRAM;  Surgeon: Sherren Mocha, MD;  Location: [place];  Service: Open Heart Surgery;  Laterality: Left;   RIGHT/LEFT HEART CATH AND CORONARY ANGIOGRAPHY N/A 05/25/2020   Procedure: RIGHT/LEFT HEART CATH AND CORONARY ANGIOGRAPHY;  Surgeon: Sherren Mocha, MD;  Location: [place] [facility];  Service: Cardiovascular;  Laterality: N/A;   ROTATOR CUFF REPAIR Right    TRANSCATHETER AORTIC VALVE REPLACEMENT, TRANSFEMORAL N/A 06/27/2020   Procedure: TRANSCATHETER AORTIC VALVE REPLACEMENT, TRANSFEMORAL;  Surgeon: Sherren Mocha, MD;  Location: [place];  Service: Open Heart Surgery;  Laterality: N/A;   ULTRASOUND GUIDANCE FOR VASCULAR ACCESS Bilateral 06/27/2020   Procedure: ULTRASOUND GUIDANCE FOR VASCULAR ACCESS;  Surgeon: Sherren Mocha, MD;  Location: [place];  Service: Open Heart Surgery;  Laterality: Bilateral;   Social History   Socioeconomic History   Marital status: Married    Spouse name: Not on file   Number of children: Not on file   Years of education: Not on file   Highest  education level: Not on file  Occupational History   Not on file  Tobacco Use   Smoking status: Never   Smokeless tobacco: Never  Vaping Use   Vaping Use: Never used  Substance and Sexual Activity   Alcohol use: Yes    Comment: occasionally   Drug use: No   Sexual activity: Not on file  Other Topics Concern   Not on file  Social History Narrative   Not on file   Social Determinants of Health   Financial Resource Strain: Not on file  Food Insecurity: Not on file  Transportation Needs: Not on file  Physical Activity: Not on file  Stress: Not on file  Social Connections: Not on file   Family History  Problem Relation Age of Onset   Cancer Mother        thyroid ca   Aneurysm Mother    Cancer Father        lung   No Known Allergies Prior to Admission medications   Medication Sig Start Date End Date Taking? Authorizing Provider  amLODipine (NORVASC) 10 MG tablet Take 0.5 tablets (5 mg total) by mouth daily. 05/19/20   Donato Heinz, MD  amoxicillin (AMOXIL) 500 MG tablet Take 4 tablets (2,000 mg total) one hour prior to all dental visits. 07/05/20   Eileen Stanford, PA-C  aspirin EC 81 MG tablet Take 1 tablet (81 mg total) by mouth daily. Swallow whole. 06/29/21   Angelena Form  R, PA-C  atorvastatin (LIPITOR) 20 MG tablet Take 20 mg by mouth at bedtime. 02/03/20   [provider]  buPROPion (WELLBUTRIN SR) 150 MG 12 hr tablet Take 150 mg by mouth 2 (two) times daily. 05/16/20   [provider]  carvedilol (COREG) 12.5 MG tablet Take 1 tablet (12.5 mg total) by mouth 2 (two) times daily. 05/23/20   Donato Heinz, MD  cloNIDine (CATAPRES) 0.1 MG tablet in the morning and at bedtime.    [provider]  [facility] Liver Oil OIL Take 1 capsule by mouth daily. 375 mg vitamin A 415 mg cod liver oil    [provider]  diazepam (VALIUM) 10 MG tablet Take 10 mg by mouth daily as needed for anxiety.    [provider]   diclofenac Sodium (VOLTAREN) 1 % GEL Apply 2 g topically daily as needed (pain).    [provider]  DULoxetine (CYMBALTA) 60 MG capsule Take 60 mg by mouth daily. 05/05/20   [provider]  losartan (COZAAR) 100 MG tablet daily.    [provider]  meloxicam (MOBIC) 15 MG tablet Take 15 mg by mouth daily as needed for pain. 05/22/20   [provider]  Omega-3 Fatty Acids (FISH OIL) 1200 MG CPDR Take 1,200 mg by mouth daily.    [provider]  OVER THE COUNTER MEDICATION Take 1 capsule by mouth daily. Go-Out    [provider]  oxyCODONE-acetaminophen (PERCOCET) 10-325 MG tablet Take 2 tablets by mouth 3 (three) times daily as needed for pain.    [provider]  Semaglutide, 1 MG/DOSE, (OZEMPIC, 1 MG/DOSE,) 2 MG/1.5ML SOPN AS DIRECTED    [provider]  Semaglutide, 1 MG/DOSE, 4 MG/3ML SOPN Inject 1 mg into the skin once a week. Patient not taking: Reported on 09/28/2021 09/17/21   Eileen Stanford, PA-C  sildenafil (REVATIO) 20 MG tablet as needed. 12/28/14   [provider]  tamsulosin (FLOMAX) 0.4 MG CAPS capsule Take 0.4 mg by mouth daily. 12/27/12   [provider]  testosterone cypionate (DEPOTESTOTERONE CYPIONATE) 100 MG/ML injection every 30 (thirty) days. 05/22/21   [provider]     Positive ROS: All other systems have been reviewed and were otherwise negative with the exception of those mentioned in the HPI and as above.  Physical Exam: General: Alert, no acute distress Cardiovascular: No pedal edema Respiratory: No cyanosis, no use of accessory musculature GI: No organomegaly, abdomen is soft and non-tender Skin: No lesions in the area of chief complaint Neurologic: Sensation intact distally Psychiatric: Patient is competent for consent with normal mood and affect Lymphatic: No axillary or cervical lymphadenopathy  MUSCULOSKELETAL: TTP left dorsal wrist around obvious  protrusion from what appears to be a ganglion cyst, full wrist and hand ROM, NVI   Imaging: no fractures   Assessment: LEFT WRIST GANGLION CYST  Plan: Plan for Procedure(s): REMOVAL GANGLION OF WRIST  The risks benefits and alternatives were discussed with the patient including but not limited to the risks of nonoperative treatment, versus surgical intervention including infection, bleeding, nerve injury,  blood clots, cardiopulmonary complications, morbidity, mortality, among others, and they were willing to proceed.   Weightbearing: WBAT LUE Orthopedic devices: none Showering: POD 3 Dressing: reinforce PRN Medicines: Tylenol, Mobic; NO NARCOTICS!  Discharge: home Follow up: 1 week    Alisa Graff Office [phone] 10/05/2021 12:26 PM

## 2021-10-08 ENCOUNTER — Other Ambulatory Visit: Payer: Self-pay | Admitting: Cardiology

## 2021-10-08 NOTE — Patient Instructions (Addendum)
DUE TO COVID-19 ONLY TWO VISITORS  (aged 67 and older)  ARE ALLOWED TO COME WITH YOU AND STAY IN THE WAITING ROOM ONLY DURING PRE OP AND PROCEDURE.   **NO VISITORS ARE ALLOWED IN THE SHORT STAY AREA OR RECOVERY ROOM!!**  IF YOU WILL BE ADMITTED INTO THE HOSPITAL YOU ARE ALLOWED ONLY FOUR SUPPORT PEOPLE DURING VISITATION HOURS ONLY (7 AM -8PM)   The support person(s) must pass our screening, gel in and out, and wear a mask at all times, including in the patient's room. Patients must also wear a mask when staff or their support person are in the room. Visitors GUEST BADGE MUST BE WORN VISIBLY  One adult visitor may remain with you overnight and MUST be in the room by 8 P.M.     Your procedure is scheduled on: 10/23/21   Report to [facility] Main Entrance    Report to admitting at : 5:30 AM   Call this number if you have problems the morning of surgery [phone]   Do not eat food :After Midnight.   After Midnight you may have the following liquids until: 4:30 AM DAY OF SURGERY  Water Black Coffee (sugar ok, NO MILK/CREAM OR CREAMERS)  Tea (sugar ok, NO MILK/CREAM OR CREAMERS) regular and decaf                             Plain Jell-O (NO RED)                                           Fruit ices (not with fruit pulp, NO RED)                                     Popsicles (NO RED)                                                                  Juice: apple, WHITE grape, WHITE cranberry Sports drinks like Gatorade (NO RED)              Drink Ensure drink AT : 4:30 AM the day of surgery.     The day of surgery:  Drink ONE (1) Pre-Surgery Clear Ensure or G2 at AM the morning of surgery. Drink in one sitting. Do not sip.  This drink was given to you during your hospital  pre-op appointment visit. Nothing else to drink after completing the  Pre-Surgery Clear Ensure or G2.          If you have questions, please contact your surgeon's office.    Oral Hygiene is also  important to reduce your risk of infection.                                    Remember - BRUSH YOUR TEETH THE MORNING OF SURGERY WITH YOUR REGULAR TOOTHPASTE   Do NOT smoke after Midnight   Take these medicines the morning of surgery with A SIP  OF WATER: bupropion,duloxetine,clonidine,carvedilol,amlodipine,Flomax.Diazepam as needed.  DO NOT TAKE ANY ORAL DIABETIC MEDICATIONS DAY OF YOUR SURGERY  Bring CPAP mask and tubing day of surgery.                              You may not have any metal on your body including hair pins, jewelry, and body piercing             Do not wear lotions, powders, perfumes/cologne, or deodorant              Men may shave face and neck.   Do not bring valuables to the hospital. [place].   Contacts, dentures or bridgework may not be worn into surgery.   Bring small overnight bag day of surgery.   DO NOT [place]. PHARMACY WILL DISPENSE MEDICATIONS LISTED ON YOUR MEDICATION LIST TO YOU DURING YOUR ADMISSION [place]!    Patients discharged on the day of surgery will not be allowed to drive home.  Someone NEEDS to stay with you for the first 24 hours after anesthesia.   Special Instructions: Bring a copy of your healthcare power of attorney and living will documents         the day of surgery if you haven't scanned them before.              Please read over the following fact sheets you were given: IF YOU HAVE QUESTIONS ABOUT YOUR PRE-OP INSTRUCTIONS PLEASE CALL [phone]     [facility] - Preparing for Surgery Before surgery, you can play an important role.  Because skin is not sterile, your skin needs to be as free of germs as possible.  You can reduce the number of germs on your skin by washing with CHG (chlorahexidine gluconate) soap before surgery.  CHG is an antiseptic cleaner which kills germs and bonds with the skin to continue killing germs even after  washing. Please DO NOT use if you have an allergy to CHG or antibacterial soaps.  If your skin becomes reddened/irritated stop using the CHG and inform your nurse when you arrive at Short Stay. Do not shave (including legs and underarms) for at least 48 hours prior to the first CHG shower.  You may shave your face/neck. Please follow these instructions carefully:  1.  Shower with CHG Soap the night before surgery and the  morning of Surgery.  2.  If you choose to wash your hair, wash your hair first as usual with your  normal  shampoo.  3.  After you shampoo, rinse your hair and body thoroughly to remove the  shampoo.                           4.  Use CHG as you would any other liquid soap.  You can apply chg directly  to the skin and wash                       Gently with a scrungie or clean washcloth.  5.  Apply the CHG Soap to your body ONLY FROM THE NECK DOWN.   Do not use on face/ open  Wound or open sores. Avoid contact with eyes, ears mouth and genitals (private parts).                       Wash face,  Genitals (private parts) with your normal soap.             6.  Wash thoroughly, paying special attention to the area where your surgery  will be performed.  7.  Thoroughly rinse your body with warm water from the neck down.  8.  DO NOT shower/wash with your normal soap after using and rinsing off  the CHG Soap.                9.  Pat yourself dry with a clean towel.            10.  Wear clean pajamas.            11.  Place clean sheets on your bed the night of your first shower and do not  sleep with pets. Day of Surgery : Do not apply any lotions/deodorants the morning of surgery.  Please wear clean clothes to the hospital/surgery center.  FAILURE TO FOLLOW THESE INSTRUCTIONS MAY RESULT IN THE CANCELLATION OF YOUR SURGERY PATIENT SIGNATURE_________________________________  NURSE  SIGNATURE__________________________________  ________________________________________________________________________   Adam Phenix  An incentive spirometer is a tool that can help keep your lungs clear and active. This tool measures how well you are filling your lungs with each breath. Taking long deep breaths may help reverse or decrease the chance of developing breathing (pulmonary) problems (especially infection) following: A long period of time when you are unable to move or be active. BEFORE THE PROCEDURE  If the spirometer includes an indicator to show your best effort, your nurse or respiratory therapist will set it to a desired goal. If possible, sit up straight or lean slightly forward. Try not to slouch. Hold the incentive spirometer in an upright position. INSTRUCTIONS FOR USE  Sit on the edge of your bed if possible, or sit up as far as you can in bed or on a chair. Hold the incentive spirometer in an upright position. Breathe out normally. Place the mouthpiece in your mouth and seal your lips tightly around it. Breathe in slowly and as deeply as possible, raising the piston or the ball toward the top of the column. Hold your breath for 3-5 seconds or for as long as possible. Allow the piston or ball to fall to the bottom of the column. Remove the mouthpiece from your mouth and breathe out normally. Rest for a few seconds and repeat Steps 1 through 7 at least 10 times every 1-2 hours when you are awake. Take your time and take a few normal breaths between deep breaths. The spirometer may include an indicator to show your best effort. Use the indicator as a goal to work toward during each repetition. After each set of 10 deep breaths, practice coughing to be sure your lungs are clear. If you have an incision (the cut made at the time of surgery), support your incision when coughing by placing a pillow or rolled up towels firmly against it. Once you are able to get out of  bed, walk around indoors and cough well. You may stop using the incentive spirometer when instructed by your caregiver.  RISKS AND COMPLICATIONS Take your time so you do not get dizzy or light-headed. If you are in pain, you may need to take or  ask for pain medication before doing incentive spirometry. It is harder to take a deep breath if you are having pain. AFTER USE Rest and breathe slowly and easily. It can be helpful to keep track of a log of your progress. Your caregiver can provide you with a simple table to help with this. If you are using the spirometer at home, follow these instructions: [place] IF:  You are having difficultly using the spirometer. You have trouble using the spirometer as often as instructed. Your pain medication is not giving enough relief while using the spirometer. You develop fever of 100.5 F (38.1 C) or higher. SEEK IMMEDIATE MEDICAL CARE IF:  You cough up bloody sputum that had not been present before. You develop fever of 102 F (38.9 C) or greater. You develop worsening pain at or near the incision site. MAKE SURE YOU:  Understand these instructions. Will watch your condition. Will get help right away if you are not doing well or get worse. Document Released: 06/24/2006 Document Revised: 05/06/2011 Document Reviewed: 08/25/2006 [facility] Patient Information 2014 [place], [place].   ________________________________________________________________________

## 2021-10-10 ENCOUNTER — Encounter (HOSPITAL_COMMUNITY)
Admission: RE | Admit: 2021-10-10 | Discharge: 2021-10-10 | Disposition: A | Discharge status: Home / Self Care | Payer: Medicare Other | Source: Ambulatory Visit | Attending: Anesthesiology | Admitting: Anesthesiology

## 2021-10-10 DIAGNOSIS — I10 Essential (primary) hypertension: Secondary | ICD-10-CM

## 2021-10-12 ENCOUNTER — Telehealth: Payer: Self-pay | Admitting: Pharmacist

## 2021-10-12 NOTE — Telephone Encounter (Signed)
Called pt to see if he was still taking Ozempic as it is marked not taking on his med list. LVM for him to call back.

## 2021-10-22 NOTE — Patient Instructions (Signed)
DUE TO SPACE LIMITATIONS, ONLY TWO VISITORS  (aged 67 and older) ARE ALLOWED TO COME WITH YOU AND STAY IN THE WAITING ROOM DURING YOUR PRE OP AND PROCEDURE.   **NO VISITORS ARE ALLOWED IN THE SHORT STAY AREA OR RECOVERY ROOM!!**   You are not required to quarantine at this time prior to your surgery. However, you must do this: Hand Hygiene often Do NOT share personal items Notify your provider if you are in close contact with someone who has COVID or you develop fever 100.4 or greater, new onset of sneezing, cough, sore throat, shortness of breath or body aches.       Your procedure is scheduled on:  Tuesday  November 06, 2021  Report to [facility] Main Entrance.  Report to admitting at:06:30     AM  +++++Call this number if you have any questions or problems the morning of surgery [phone]  Do not eat food :After Midnight the night prior to your surgery/procedure.  After Midnight you may have the following liquids until    05:45 AM DAY OF SURGERY  Clear Liquid Diet Water Black Coffee (sugar ok, NO MILK/CREAM OR CREAMERS)  Tea (sugar ok, NO MILK/CREAM OR CREAMERS) regular and decaf                             Plain Jell-O (NO RED)                                           Fruit ices (not with fruit pulp, NO RED)                                     Popsicles (NO RED)                                                                  Juice: apple, WHITE grape, WHITE cranberry Sports drinks like Gatorade (NO RED)                   The day of surgery:  Drink ONE (1) Pre-Surgery Clear Ensure  at  05:45      AM the morning of surgery. Drink in one sitting. Do not sip.  This drink was given to you during your hospital pre-op appointment visit. Nothing else to drink after completing the Pre-Surgery Clear Ensure     FOLLOW ANY ADDITIONAL PRE OP INSTRUCTIONS YOU RECEIVED FROM YOUR SURGEON'S OFFICE!!!   Oral Hygiene is also important to reduce your risk of infection.         Remember - BRUSH YOUR TEETH THE MORNING OF SURGERY WITH YOUR REGULAR TOOTHPASTE  Take ONLY these medicines the morning of surgery with A SIP OF WATER:  ????? Wait on Pharm   Bring CPAP mask and tubing day of surgery.                   You may not have any metal on your body including jewelry, and body piercing  Do not wear  lotions,  powders, cologne, or deodorant  Men may shave face and neck.  Contacts, Hearing Aids, dentures or bridgework may not be worn into surgery.   DO NOT [place]. PHARMACY WILL DISPENSE MEDICATIONS LISTED ON YOUR MEDICATION LIST TO YOU DURING YOUR ADMISSION [facility]!   Patients discharged on the day of surgery will not be allowed to drive home.  Someone NEEDS to stay with you for the first 24 hours after anesthesia.  Special Instructions: Bring a copy of your healthcare power of attorney and living will documents the day of surgery, if you wish to have them scanned into your  Medical Records- EPIC  Please read over the following fact sheets you were given: IF YOU HAVE QUESTIONS ABOUT YOUR PRE-OP INSTRUCTIONS, PLEASE CALL [phone]  ([place])   Hand - Preparing for Surgery Before surgery, you can play an important role.  Because skin is not sterile, your skin needs to be as free of germs as possible.  You can reduce the number of germs on your skin by washing with CHG (chlorahexidine gluconate) soap before surgery.  CHG is an antiseptic cleaner which kills germs and bonds with the skin to continue killing germs even after washing. Please DO NOT use if you have an allergy to CHG or antibacterial soaps.  If your skin becomes reddened/irritated stop using the CHG and inform your nurse when you arrive at Short Stay. Do not shave (including legs and underarms) for at least 48 hours prior to the first CHG shower.  You may shave your face/neck.  Please follow these instructions carefully:  1.  Shower with CHG  Soap the night before surgery and the  morning of surgery.  2.  If you choose to wash your hair, wash your hair first as usual with your normal  shampoo.  3.  After you shampoo, rinse your hair and body thoroughly to remove the shampoo.                             4.  Use CHG as you would any other liquid soap.  You can apply chg directly to the skin and wash.  Gently with a scrungie or clean washcloth.  5.  Apply the CHG Soap to your body ONLY FROM THE NECK DOWN.   Do not use on face/ open                           Wound or open sores. Avoid contact with eyes, ears mouth and genitals (private parts).                       Wash face,  Genitals (private parts) with your normal soap.             6.  Wash thoroughly, paying special attention to the area where your  surgery  will be performed.  7.  Thoroughly rinse your body with warm water from the neck down.  8.  DO NOT shower/wash with your normal soap after using and rinsing off the CHG Soap.            9.  Pat yourself dry with a clean towel.            10.  Wear clean pajamas.            11.  Place clean sheets on  your bed the night of your first shower and do not  sleep with pets.  ON THE DAY OF SURGERY : Do not apply any lotions/deodorants the morning of surgery.  Please wear clean clothes to the hospital/surgery center.    FAILURE TO FOLLOW THESE INSTRUCTIONS MAY RESULT IN THE CANCELLATION OF YOUR SURGERY  PATIENT SIGNATURE_________________________________  NURSE SIGNATURE__________________________________  ________________________________________________________________________     Adam Phenix    An incentive spirometer is a tool that can help keep your lungs clear and active. This tool measures how well you are filling your lungs with each breath. Taking long deep breaths may help reverse or decrease the chance of developing breathing (pulmonary) problems (especially infection) following: A long period of time when  you are unable to move or be active. BEFORE THE PROCEDURE  If the spirometer includes an indicator to show your best effort, your nurse or respiratory therapist will set it to a desired goal. If possible, sit up straight or lean slightly forward. Try not to slouch. Hold the incentive spirometer in an upright position. INSTRUCTIONS FOR USE  Sit on the edge of your bed if possible, or sit up as far as you can in bed or on a chair. Hold the incentive spirometer in an upright position. Breathe out normally. Place the mouthpiece in your mouth and seal your lips tightly around it. Breathe in slowly and as deeply as possible, raising the piston or the ball toward the top of the column. Hold your breath for 3-5 seconds or for as long as possible. Allow the piston or ball to fall to the bottom of the column. Remove the mouthpiece from your mouth and breathe out normally. Rest for a few seconds and repeat Steps 1 through 7 at least 10 times every 1-2 hours when you are awake. Take your time and take a few normal breaths between deep breaths. The spirometer may include an indicator to show your best effort. Use the indicator as a goal to work toward during each repetition. After each set of 10 deep breaths, practice coughing to be sure your lungs are clear. If you have an incision (the cut made at the time of surgery), support your incision when coughing by placing a pillow or rolled up towels firmly against it. Once you are able to get out of bed, walk around indoors and cough well. You may stop using the incentive spirometer when instructed by your caregiver.  RISKS AND COMPLICATIONS Take your time so you do not get dizzy or light-headed. If you are in pain, you may need to take or ask for pain medication before doing incentive spirometry. It is harder to take a deep breath if you are having pain. AFTER USE Rest and breathe slowly and easily. It can be helpful to keep track of a log of your progress.  Your caregiver can provide you with a simple table to help with this. If you are using the spirometer at home, follow these instructions: [place] IF:  You are having difficultly using the spirometer. You have trouble using the spirometer as often as instructed. Your pain medication is not giving enough relief while using the spirometer. You develop fever of 100.5 F (38.1 C) or higher.  SEEK IMMEDIATE MEDICAL CARE IF:  You cough up bloody sputum that had not been present before. You develop fever of 102 F (38.9 C) or greater. You develop worsening pain at or near the incision site. MAKE SURE YOU:  Understand these instructions. Will watch your condition. Will get help right away if you are not doing well or get worse. Document Released: 06/24/2006 Document Revised: 05/06/2011 Document Reviewed: 08/25/2006 [facility] Patient Information 2014 [place], [place].

## 2021-10-22 NOTE — Progress Notes (Addendum)
COVID Vaccine received:  '[]'$  No '[x]'$  Yes Date of any COVID positive Test in last 90 days:none  PCP - Candi Leash, PA-C Cardiologist - Oswaldo Milian, MD    Melina Copa, PA-C  clearance 10-04-21 office note  Chest x-ray - 06-23-20 Epic EKG - 07-05-2020  Epic   Stress Test -  ECHO - 06-29-21  Epic  Cardiac Cath - 05-25-21  Dr. Burt Knack   Epic  Pacemaker/ICD device     '[x]'$  N/A Spinal Cord Stimulator:'[x]'$  No '[]'$  Yes     Other Implants:   History of Sleep Apnea? '[]'$  No '[x]'$  Yes   Sleep Study Date:   CPAP used?- '[x]'$  No '[]'$  Yes    Does the patient monitor blood sugar? '[]'$  No '[]'$  Yes  '[x]'$  N/A  Blood Thinner Instructions:  none Aspirin Instructions:  ASA 81 mg,  hold 7 days OK'd per Melina Copa 10-04-21 note Last Dose:  ERAS Protocol Ordered: '[]'$  No  '[x]'$  Yes PRE-SURGERY '[x]'$  ENSURE  '[]'$  G2   Comments: I have ordered at [facility] for this patient.  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  Patient verbalized understanding and agreement to the Pre-Surgical Instructions that were given to them at this PAT appointment. Patient was also educated of the need to review these PAT instructions again prior to his/her surgery.I reviewed the appropriate phone numbers to call if they have any and questions or concerns.

## 2021-10-23 ENCOUNTER — Other Ambulatory Visit: Payer: Self-pay | Admitting: Cardiology

## 2021-10-24 ENCOUNTER — Encounter (HOSPITAL_COMMUNITY): Payer: Self-pay

## 2021-10-24 ENCOUNTER — Other Ambulatory Visit: Payer: Self-pay

## 2021-10-24 ENCOUNTER — Encounter (HOSPITAL_COMMUNITY)
Admission: RE | Admit: 2021-10-24 | Discharge: 2021-10-24 | Disposition: A | Discharge status: Home / Self Care | Payer: Medicare Other | Source: Ambulatory Visit | Attending: Orthopedic Surgery | Admitting: Orthopedic Surgery

## 2021-10-24 DIAGNOSIS — Z01818 Encounter for other preprocedural examination: Secondary | ICD-10-CM | POA: Insufficient documentation

## 2021-10-24 DIAGNOSIS — I10 Essential (primary) hypertension: Secondary | ICD-10-CM | POA: Insufficient documentation

## 2021-10-24 HISTORY — DX: Unspecified asthma, uncomplicated: J45.909

## 2021-10-24 HISTORY — DX: Headache, unspecified: R51.9

## 2021-10-24 LAB — BASIC METABOLIC PANEL
Anion gap: 8 (ref 5–15)
BUN: 15 mg/dL (ref 8–23)
CO2: 28 mmol/L (ref 22–32)
Calcium: 9.5 mg/dL (ref 8.9–10.3)
Chloride: 107 mmol/L (ref 98–111)
Creatinine, Ser: 1.1 mg/dL (ref 0.61–1.24)
GFR, Estimated: >60 mL/min (ref >60)
Glucose, Bld: 96 mg/dL (ref 70–99)
Potassium: 3.7 mmol/L (ref 3.5–5.1)
Sodium: 143 mmol/L (ref 135–145)

## 2021-10-24 LAB — CBC
HCT: 41.3 % (ref 39.0–52.0)
Hemoglobin: 13.7 g/dL (ref 13.0–17.0)
MCH: 31.2 pg (ref 26.0–34.0)
MCHC: 33.2 g/dL (ref 30.0–36.0)
MCV: 94.1 fL (ref 80.0–100.0)
Platelets: 240 10*3/uL (ref 150–400)
RBC: 4.39 MIL/uL (ref 4.22–5.81)
RDW: 13.1 % (ref 11.5–15.5)
WBC: 8.4 10*3/uL (ref 4.0–10.5)
nRBC: 0 % (ref 0.0–0.2)

## 2021-10-25 ENCOUNTER — Other Ambulatory Visit: Payer: Self-pay | Admitting: Physician Assistant

## 2021-10-26 ENCOUNTER — Other Ambulatory Visit: Payer: Self-pay | Admitting: Physician Assistant

## 2021-10-26 ENCOUNTER — Other Ambulatory Visit: Payer: Self-pay

## 2021-10-30 NOTE — Telephone Encounter (Signed)
Patient is still taking ozmepic. Ready to increase to '2mg'$ . Advised to let me know if pharmacy cannot get. Does need to hold for procedure coming up. Rx sent to pharmacy.

## 2021-10-31 ENCOUNTER — Other Ambulatory Visit: Payer: Self-pay | Admitting: Pharmacist

## 2021-11-05 ENCOUNTER — Ambulatory Visit: Payer: Medicare Other | Admitting: Cardiology

## 2021-11-05 NOTE — Anesthesia Preprocedure Evaluation (Addendum)
Anesthesia Evaluation  Patient identified by MRN, date of birth, ID band Patient awake    Reviewed: Allergy & Precautions, NPO status , Patient's Chart, lab work & pertinent test results  Airway Mallampati: III  TM Distance: >3 FB Neck ROM: Full    Dental   Pulmonary asthma , sleep apnea ,    breath sounds clear to auscultation       Cardiovascular hypertension, Pt. on medications + Valvular Problems/Murmurs (s/p TAVR 06/2020)  Rhythm:Regular Rate:Normal  Normal EF. Mild perivalvular leak s/p TAVR. Mean gradient of 35mHg.   Neuro/Psych negative neurological ROS     GI/Hepatic negative GI ROS, Neg liver ROS,   Endo/Other  diabetes, Type 2Morbid obesity  Renal/GU negative Renal ROS     Musculoskeletal  (+) Arthritis ,   Abdominal   Peds  Hematology negative hematology ROS (+)   Anesthesia Other Findings   Reproductive/Obstetrics                            Lab Results  Component Value Date   WBC 8.4 10/24/2021   HGB 13.7 10/24/2021   HCT 41.3 10/24/2021   MCV 94.1 10/24/2021   PLT 240 10/24/2021   Lab Results  Component Value Date   CREATININE 1.10 10/24/2021   BUN 15 10/24/2021   NA 143 10/24/2021   K 3.7 10/24/2021   CL 107 10/24/2021   CO2 28 10/24/2021    Anesthesia Physical Anesthesia Plan  ASA: 3  Anesthesia Plan: General   Post-op Pain Management: Tylenol PO (pre-op)*, Toradol IV (intra-op)* and Minimal or no pain anticipated   Induction: Intravenous and Rapid sequence  PONV Risk Score and Plan: 2 and Dexamethasone, Ondansetron and Treatment may vary due to age or medical condition  Airway Management Planned: Oral ETT and Video Laryngoscope Planned  Additional Equipment: None  Intra-op Plan:   Post-operative Plan: Extubation in OR  Informed Consent: I have reviewed the patients History and Physical, chart, labs and discussed the procedure including the risks,  benefits and alternatives for the proposed anesthesia with the patient or authorized representative who has indicated his/her understanding and acceptance.     Dental advisory given  Plan Discussed with: CRNA  Anesthesia Plan Comments:        Anesthesia Quick Evaluation

## 2021-11-05 NOTE — Progress Notes (Signed)
Anesthesia Chart Review   Case: [id number] Date/Time: 11/06/21 0830   Procedure: REMOVAL GANGLION OF WRIST (Left: Wrist)   Anesthesia type: Choice   Pre-op diagnosis: LEFT WRIST GANGLION CYST   Location: [facility] ROOM 04 / [facility]   Surgeons: Renette Butters, MD       DISCUSSION:66 y.o. 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 Edmonia Lynch.   Pt last seen by cardiology 10/04/2021. Per OV note, "Preoperative Cardiovascular Risk Assessment: RCRI 0.9% indicating low CV risk. Given morbid obesity and medical history would likely classify him more in the moderate risk range. The patient affirms he has been doing well without any new cardiac symptoms. He has chronic exertional dyspnea which he states dates back to even before cath (which showed normal coronaries) and echo (which showed improved LVEF). Therefore, based on ACC/AHA guidelines, the patient would be at acceptable risk for the planned procedure without further cardiovascular testing. The patient was advised that if he develops new symptoms prior to surgery to contact our office to arrange for a follow-up visit, and he verbalized understanding."  Pt advised to hold Ozempic 7 days prior to procedure.   Anticipate pt can proceed with planned procedure barring acute status change.   VS: BP (!) 142/78 Comment: right arm sitting  Pulse 68   Temp 37 C (Oral)   Resp 20   Ht 6' (1.829 m)   Wt (!) 174.2 kg Comment: will order a BARIBED,  SpO2 98%   BMI 52.08 kg/m   PROVIDERS: Candi Leash, PA-C is PCP   Primary Cardiologist:  Donato Heinz, MD LABS: Labs reviewed: Acceptable for surgery. (all labs ordered are listed, but only abnormal results are displayed)  Labs Reviewed  BASIC METABOLIC PANEL  CBC     IMAGES:   EKG:   CV: Echo 06/29/2021 1. The aortic valve has been replaced with a 29 mm Edwards Sapien Valve.  Aortic  valve regurgitation is mild and paralvalvular adjavcent to the  mitral annulus. Though PHT 467 m2, VC is 0.3 cm. Mean gradient 20 mm Hg,  Peak gradient 34 mm Hg. Effective  orifice area 1.68 cm2. DVI 0.37.   2. Left ventricular ejection fraction, by estimation, is 55 to 60%. The  left ventricle has normal function. The left ventricle has no regional  wall motion abnormalities. There is mild concentric left ventricular  hypertrophy. Left ventricular diastolic  parameters are consistent with Grade I diastolic dysfunction (impaired  relaxation).   3. Right ventricular systolic function is normal. The right ventricular  size is normal. Tricuspid regurgitation signal is inadequate for assessing  PA pressure.   4. The mitral valve is grossly normal. Trivial mitral valve  regurgitation.  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 Burt Knack and Dr. Cyndia Bent.    Sleep apnea    no CPAP use   Tinnitus aurium    Venous stasis     Past Surgical History:  Procedure Laterality Date   ANAL  FISSURE REPAIR     FRACTURE SURGERY     INTRAOPERATIVE TRANSTHORACIC ECHOCARDIOGRAM Left 06/27/2020   Procedure: INTRAOPERATIVE TRANSTHORACIC ECHOCARDIOGRAM;  Surgeon: Sherren Mocha, MD;  Location: [place];  Service: Open Heart Surgery;  Laterality: Left;   RIGHT/LEFT HEART CATH AND CORONARY ANGIOGRAPHY N/A 05/25/2020   Procedure: RIGHT/LEFT HEART CATH AND CORONARY ANGIOGRAPHY;  Surgeon: Sherren Mocha, MD;  Location: [place] [facility];  Service: Cardiovascular;  Laterality: N/A;   ROTATOR CUFF REPAIR Right    TRANSCATHETER AORTIC VALVE REPLACEMENT, TRANSFEMORAL N/A 06/27/2020   Procedure:  TRANSCATHETER AORTIC VALVE REPLACEMENT, TRANSFEMORAL;  Surgeon: Sherren Mocha, MD;  Location: [place];  Service: Open Heart Surgery;  Laterality: N/A;   ULTRASOUND GUIDANCE FOR VASCULAR ACCESS Bilateral 06/27/2020   Procedure: ULTRASOUND GUIDANCE FOR VASCULAR ACCESS;  Surgeon: Sherren Mocha, MD;  Location: [place];  Service: Open Heart Surgery;  Laterality: Bilateral;    MEDICATIONS:  amLODipine (NORVASC) 5 MG tablet   amoxicillin (AMOXIL) 500 MG tablet   aspirin EC 81 MG tablet   carvedilol (COREG) 12.5 MG tablet   Cod Liver Oil OIL   diazepam (VALIUM) 10 MG tablet   diclofenac Sodium (VOLTAREN) 1 % GEL   DULoxetine (CYMBALTA) 60 MG capsule   ibuprofen (ADVIL) 200 MG tablet   losartan (COZAAR) 100 MG tablet   meloxicam (MOBIC) 15 MG tablet   Omega-3 Fatty Acids (FISH OIL) 1200 MG CPDR   OVER THE COUNTER MEDICATION   oxyCODONE-acetaminophen (PERCOCET) 10-325 MG tablet   Semaglutide, 2 MG/DOSE, (OZEMPIC, 2 MG/DOSE,) 8 MG/3ML SOPN   tamsulosin (FLOMAX) 0.4 MG CAPS capsule   testosterone cypionate (DEPOTESTOTERONE CYPIONATE) 100 MG/ML injection   No current facility-administered medications for this encounter.    Konrad Felix Ward, PA-C [facility] Pre-Surgical Testing [phone]

## 2021-11-06 ENCOUNTER — Other Ambulatory Visit: Payer: Self-pay

## 2021-11-06 ENCOUNTER — Encounter (HOSPITAL_COMMUNITY)
Admission: RE | Disposition: A | Discharge status: Home / Self Care | Payer: Self-pay | Source: Ambulatory Visit | Attending: Orthopedic Surgery

## 2021-11-06 ENCOUNTER — Ambulatory Visit (HOSPITAL_COMMUNITY)
Admission: RE | Admit: 2021-11-06 | Discharge: 2021-11-06 | Disposition: A | Discharge status: Home / Self Care | Payer: Medicare Other | Source: Ambulatory Visit | Attending: Orthopedic Surgery | Admitting: Orthopedic Surgery

## 2021-11-06 ENCOUNTER — Encounter (HOSPITAL_COMMUNITY): Payer: Self-pay | Admitting: Orthopedic Surgery

## 2021-11-06 ENCOUNTER — Ambulatory Visit (HOSPITAL_BASED_OUTPATIENT_CLINIC_OR_DEPARTMENT_OTHER): Payer: Medicare Other | Admitting: Registered Nurse

## 2021-11-06 ENCOUNTER — Ambulatory Visit (HOSPITAL_COMMUNITY): Payer: Medicare Other | Admitting: Registered Nurse

## 2021-11-06 DIAGNOSIS — I10 Essential (primary) hypertension: Secondary | ICD-10-CM | POA: Insufficient documentation

## 2021-11-06 DIAGNOSIS — G4730 Sleep apnea, unspecified: Secondary | ICD-10-CM | POA: Diagnosis not present

## 2021-11-06 DIAGNOSIS — Z952 Presence of prosthetic heart valve: Secondary | ICD-10-CM | POA: Diagnosis not present

## 2021-11-06 DIAGNOSIS — E119 Type 2 diabetes mellitus without complications: Secondary | ICD-10-CM | POA: Diagnosis not present

## 2021-11-06 DIAGNOSIS — Z6841 Body Mass Index (BMI) 40.0 and over, adult: Secondary | ICD-10-CM | POA: Insufficient documentation

## 2021-11-06 DIAGNOSIS — J45909 Unspecified asthma, uncomplicated: Secondary | ICD-10-CM

## 2021-11-06 DIAGNOSIS — M67432 Ganglion, left wrist: Secondary | ICD-10-CM | POA: Insufficient documentation

## 2021-11-06 DIAGNOSIS — Z79899 Other long term (current) drug therapy: Secondary | ICD-10-CM | POA: Insufficient documentation

## 2021-11-06 DIAGNOSIS — Z7985 Long-term (current) use of injectable non-insulin antidiabetic drugs: Secondary | ICD-10-CM | POA: Diagnosis not present

## 2021-11-06 HISTORY — PX: GANGLION CYST EXCISION: SHX1691

## 2021-11-06 LAB — GLUCOSE, CAPILLARY: Glucose-Capillary: 96 mg/dL (ref 70–99)

## 2021-11-06 SURGERY — EXCISION, GANGLION CYST, WRIST
Anesthesia: General | Site: Wrist | Laterality: Left

## 2021-11-06 MED ORDER — DEXAMETHASONE SODIUM PHOSPHATE 10 MG/ML IJ SOLN
8.0000 mg | Freq: Once | INTRAMUSCULAR | Status: AC
  Administered 2021-11-06: 8 mg via INTRAVENOUS

## 2021-11-06 MED ORDER — ACETAMINOPHEN 500 MG PO TABS: 500.0000 mg | ORAL_TABLET | Freq: Four times a day (QID) | ORAL | 0 refills | Status: AC | PRN

## 2021-11-06 MED ORDER — CHLORHEXIDINE GLUCONATE 0.12 % MT SOLN
15.0000 mL | Freq: Once | OROMUCOSAL | Status: AC
  Administered 2021-11-06: 15 mL via OROMUCOSAL

## 2021-11-06 MED ORDER — GABAPENTIN 300 MG PO CAPS: 300.0000 mg | ORAL_CAPSULE | Freq: Two times a day (BID) | ORAL | 0 refills | Status: AC

## 2021-11-06 MED ORDER — FENTANYL CITRATE (PF) 100 MCG/2ML IJ SOLN
INTRAMUSCULAR | Status: DC | PRN
  Administered 2021-11-06: 50 ug via INTRAVENOUS

## 2021-11-06 MED ORDER — BUPIVACAINE HCL (PF) 0.25 % IJ SOLN
INTRAMUSCULAR | Status: DC | PRN
  Administered 2021-11-06: 5 mL

## 2021-11-06 MED ORDER — DEXAMETHASONE SODIUM PHOSPHATE 10 MG/ML IJ SOLN
INTRAMUSCULAR | Status: AC
  Filled 2021-11-06: qty 1

## 2021-11-06 MED ORDER — PHENYLEPHRINE 80 MCG/ML (10ML) SYRINGE FOR IV PUSH (FOR BLOOD PRESSURE SUPPORT)
PREFILLED_SYRINGE | INTRAVENOUS | Status: DC | PRN
  Administered 2021-11-06: 80 ug via INTRAVENOUS
  Administered 2021-11-06 (×2): 160 ug via INTRAVENOUS

## 2021-11-06 MED ORDER — ACETAMINOPHEN 10 MG/ML IV SOLN
INTRAVENOUS | Status: DC | PRN
  Administered 2021-11-06: 1000 mg via INTRAVENOUS

## 2021-11-06 MED ORDER — ONDANSETRON HCL 4 MG/2ML IJ SOLN
INTRAMUSCULAR | Status: DC | PRN
  Administered 2021-11-06: 4 mg via INTRAVENOUS

## 2021-11-06 MED ORDER — 0.9 % SODIUM CHLORIDE (POUR BTL) OPTIME
TOPICAL | Status: DC | PRN
  Administered 2021-11-06: 1000 mL

## 2021-11-06 MED ORDER — PROPOFOL 10 MG/ML IV BOLUS
INTRAVENOUS | Status: AC
  Filled 2021-11-06: qty 20

## 2021-11-06 MED ORDER — FENTANYL CITRATE (PF) 100 MCG/2ML IJ SOLN
INTRAMUSCULAR | Status: AC
  Filled 2021-11-06: qty 2

## 2021-11-06 MED ORDER — LIDOCAINE HCL (PF) 2 % IJ SOLN
INTRAMUSCULAR | Status: AC
  Filled 2021-11-06: qty 5

## 2021-11-06 MED ORDER — ONDANSETRON HCL 4 MG/2ML IJ SOLN
INTRAMUSCULAR | Status: AC
  Filled 2021-11-06: qty 2

## 2021-11-06 MED ORDER — SUCCINYLCHOLINE CHLORIDE 200 MG/10ML IV SOSY
PREFILLED_SYRINGE | INTRAVENOUS | Status: DC | PRN
  Administered 2021-11-06: 140 mg via INTRAVENOUS

## 2021-11-06 MED ORDER — CEFAZOLIN IN SODIUM CHLORIDE 3-0.9 GM/100ML-% IV SOLN
3.0000 g | INTRAVENOUS | Status: AC
  Administered 2021-11-06: 3 g via INTRAVENOUS
  Filled 2021-11-06: qty 100

## 2021-11-06 MED ORDER — SUCCINYLCHOLINE CHLORIDE 200 MG/10ML IV SOSY
PREFILLED_SYRINGE | INTRAVENOUS | Status: AC
  Filled 2021-11-06: qty 10

## 2021-11-06 MED ORDER — PHENYLEPHRINE HCL-NACL 20-0.9 MG/250ML-% IV SOLN
INTRAVENOUS | Status: DC | PRN
  Administered 2021-11-06: 30 ug/min via INTRAVENOUS

## 2021-11-06 MED ORDER — MIDAZOLAM HCL 2 MG/2ML IJ SOLN
INTRAMUSCULAR | Status: AC
  Filled 2021-11-06: qty 2

## 2021-11-06 MED ORDER — PROPOFOL 10 MG/ML IV BOLUS
INTRAVENOUS | Status: DC | PRN
  Administered 2021-11-06: 300 mg via INTRAVENOUS

## 2021-11-06 MED ORDER — BUPIVACAINE HCL (PF) 0.25 % IJ SOLN
INTRAMUSCULAR | Status: AC
  Filled 2021-11-06: qty 30

## 2021-11-06 MED ORDER — ACETAMINOPHEN 500 MG PO TABS
1000.0000 mg | ORAL_TABLET | Freq: Once | ORAL | Status: DC
  Filled 2021-11-06: qty 2

## 2021-11-06 MED ORDER — FENTANYL CITRATE PF 50 MCG/ML IJ SOSY: 25.0000 ug | PREFILLED_SYRINGE | INTRAMUSCULAR | Status: DC | PRN

## 2021-11-06 MED ORDER — MIDAZOLAM HCL 5 MG/5ML IJ SOLN
INTRAMUSCULAR | Status: DC | PRN
  Administered 2021-11-06: 2 mg via INTRAVENOUS

## 2021-11-06 MED ORDER — LIDOCAINE 2% (20 MG/ML) 5 ML SYRINGE
INTRAMUSCULAR | Status: DC | PRN
  Administered 2021-11-06: 60 mg via INTRAVENOUS

## 2021-11-06 MED ORDER — EPHEDRINE SULFATE-NACL 50-0.9 MG/10ML-% IV SOSY
PREFILLED_SYRINGE | INTRAVENOUS | Status: DC | PRN
  Administered 2021-11-06 (×2): 5 mg via INTRAVENOUS
  Administered 2021-11-06 (×2): 10 mg via INTRAVENOUS

## 2021-11-06 MED ORDER — GLYCOPYRROLATE PF 0.2 MG/ML IJ SOSY
PREFILLED_SYRINGE | INTRAMUSCULAR | Status: DC | PRN
  Administered 2021-11-06: .2 mg via INTRAVENOUS

## 2021-11-06 MED ORDER — POVIDONE-IODINE 10 % EX SWAB
2.0000 | Freq: Once | CUTANEOUS | Status: AC
  Administered 2021-11-06: 2 via TOPICAL

## 2021-11-06 MED ORDER — AMISULPRIDE (ANTIEMETIC) 5 MG/2ML IV SOLN: 10.0000 mg | Freq: Once | INTRAVENOUS | Status: DC | PRN

## 2021-11-06 MED ORDER — ORAL CARE MOUTH RINSE: 15.0000 mL | Freq: Once | OROMUCOSAL | Status: AC

## 2021-11-06 MED ORDER — ROCURONIUM BROMIDE 10 MG/ML (PF) SYRINGE
PREFILLED_SYRINGE | INTRAVENOUS | Status: AC
  Filled 2021-11-06: qty 10

## 2021-11-06 MED ORDER — ACETAMINOPHEN 10 MG/ML IV SOLN
INTRAVENOUS | Status: AC
  Filled 2021-11-06: qty 100

## 2021-11-06 MED ORDER — LACTATED RINGERS IV SOLN: INTRAVENOUS | Status: DC

## 2021-11-06 SURGICAL SUPPLY — 48 items
APL PRP STRL LF DISP 70% ISPRP (MISCELLANEOUS) ×1
BLADE SURG 15 STRL LF DISP TIS (BLADE) ×1 IMPLANT
BLADE SURG 15 STRL SS (BLADE) ×1
BLADE SURG SZ11 CARB STEEL (BLADE) IMPLANT
BNDG CMPR 5X3 CHSV STRCH STRL (GAUZE/BANDAGES/DRESSINGS)
BNDG CMPR 9X4 STRL LF SNTH (GAUZE/BANDAGES/DRESSINGS)
BNDG COHESIVE 3X5 TAN ST LF (GAUZE/BANDAGES/DRESSINGS) IMPLANT
BNDG ELASTIC 2X5.8 VLCR STR LF (GAUZE/BANDAGES/DRESSINGS) IMPLANT
BNDG ELASTIC 3X5.8 VLCR STR LF (GAUZE/BANDAGES/DRESSINGS) ×1 IMPLANT
BNDG ESMARK 4X9 LF (GAUZE/BANDAGES/DRESSINGS) IMPLANT
BNDG GAUZE DERMACEA FLUFF 4 (GAUZE/BANDAGES/DRESSINGS) ×1 IMPLANT
BNDG GZE DERMACEA 4 6PLY (GAUZE/BANDAGES/DRESSINGS) ×1
CHLORAPREP W/TINT 26 (MISCELLANEOUS) ×1 IMPLANT
CLSR STERI-STRIP ANTIMIC 1/2X4 (GAUZE/BANDAGES/DRESSINGS) ×1 IMPLANT
CORD BIPOLAR FORCEPS 12FT (ELECTRODE) IMPLANT
COVER BACK TABLE 60X90IN (DRAPES) ×1 IMPLANT
CUFF TOURN SGL QUICK 18X4 (TOURNIQUET CUFF) ×1 IMPLANT
DRAPE EXTREMITY T 121X128X90 (DISPOSABLE) ×1 IMPLANT
DRAPE IMP U-DRAPE 54X76 (DRAPES) ×1 IMPLANT
DRAPE SURG 17X23 STRL (DRAPES) ×1 IMPLANT
DRSG EMULSION OIL 3X3 NADH (GAUZE/BANDAGES/DRESSINGS) ×1 IMPLANT
GAUZE PAD ABD 8X10 STRL (GAUZE/BANDAGES/DRESSINGS) IMPLANT
GAUZE SPONGE 4X4 12PLY STRL (GAUZE/BANDAGES/DRESSINGS) ×1 IMPLANT
GLOVE BIO SURGEON STRL SZ7.5 (GLOVE) ×1 IMPLANT
GLOVE BIOGEL PI IND STRL 7.5 (GLOVE) ×1 IMPLANT
GLOVE BIOGEL PI IND STRL 8 (GLOVE) ×1 IMPLANT
GLOVE SURG SYN 7.5  E (GLOVE) ×1
GLOVE SURG SYN 7.5 E (GLOVE) ×1 IMPLANT
GLOVE SURG SYN 7.5 PF PI (GLOVE) ×1 IMPLANT
GOWN STRL REUS W/ TWL LRG LVL3 (GOWN DISPOSABLE) ×1 IMPLANT
GOWN STRL REUS W/ TWL XL LVL3 (GOWN DISPOSABLE) ×1 IMPLANT
GOWN STRL REUS W/TWL LRG LVL3 (GOWN DISPOSABLE) ×1
GOWN STRL REUS W/TWL XL LVL3 (GOWN DISPOSABLE) ×1
KIT BASIN OR (CUSTOM PROCEDURE TRAY) ×1 IMPLANT
NDL HYPO 25X1 1.5 SAFETY (NEEDLE) IMPLANT
NEEDLE HYPO 25X1 1.5 SAFETY (NEEDLE) IMPLANT
NS IRRIG 1000ML POUR BTL (IV SOLUTION) ×1 IMPLANT
PAD CAST 3X4 CTTN HI CHSV (CAST SUPPLIES) IMPLANT
PADDING CAST ABS COTTON 3X4 (CAST SUPPLIES) IMPLANT
PADDING CAST ABS COTTON 4X4 ST (CAST SUPPLIES) ×1 IMPLANT
PADDING CAST COTTON 3X4 STRL (CAST SUPPLIES)
SUT ETHILON 3 0 PS 1 (SUTURE) IMPLANT
SUT MNCRL AB 3-0 PS2 18 (SUTURE) IMPLANT
SUT MNCRL AB 4-0 PS2 18 (SUTURE) IMPLANT
SYR BULB EAR ULCER 3OZ GRN STR (SYRINGE) ×1 IMPLANT
SYR CONTROL 10ML LL (SYRINGE) IMPLANT
TOWEL GREEN STERILE FF (TOWEL DISPOSABLE) ×1 IMPLANT
UNDERPAD 30X36 HEAVY ABSORB (UNDERPADS AND DIAPERS) ×1 IMPLANT

## 2021-11-06 NOTE — Anesthesia Postprocedure Evaluation (Signed)
Anesthesia Post Note  Patient: Dakota Meyer  Procedure(s) Performed: REMOVAL GANGLION OF WRIST (Left: Wrist)     Patient location during evaluation: PACU Anesthesia Type: General Level of consciousness: awake and alert Pain management: pain level controlled Vital Signs Assessment: post-procedure vital signs reviewed and stable Respiratory status: spontaneous breathing, nonlabored ventilation, respiratory function stable and patient connected to nasal cannula oxygen Cardiovascular status: blood pressure returned to baseline and stable Postop Assessment: no apparent nausea or vomiting Anesthetic complications: no   No notable events documented.  Last Vitals:  Vitals:   11/06/21 1048 11/06/21 1100  BP: 114/64 114/63  Pulse: 68 73  Resp:    Temp:    SpO2: 94% 98%    Last Pain:  Vitals:   11/06/21 1048  PainSc: 0-No pain                 Tiajuana Amass

## 2021-11-06 NOTE — Op Note (Signed)
11/06/2021  9:45 AM  PATIENT:  Dakota Meyer    PRE-OPERATIVE DIAGNOSIS:  LEFT WRIST GANGLION CYST  POST-OPERATIVE DIAGNOSIS:  Same  PROCEDURE:  REMOVAL GANGLION OF WRIST  SURGEON:  Renette Butters, MD  ASSISTANT: Aggie Moats, PA-C, he was present and scrubbed throughout the case, critical for completion in a timely fashion, and for retraction, instrumentation, and closure.   ANESTHESIA:   gen  PREOPERATIVE INDICATIONS:  [name] is a  66 y.o. male with a diagnosis of LEFT WRIST GANGLION CYST who failed conservative measures and elected for surgical management.    The risks benefits and alternatives were discussed with the patient preoperatively including but not limited to the risks of infection, bleeding, nerve injury, cardiopulmonary complications, the need for revision surgery, among others, and the patient was willing to proceed.  OPERATIVE IMPLANTS: none  OPERATIVE FINDINGS: dorsal ext tendon ganglion  BLOOD LOSS: min  COMPLICATIONS: none  TOURNIQUET TIME: 44mn  OPERATIVE PROCEDURE:  Patient was identified in the preoperative holding area and site was marked by me He was transported to the operating theater and placed on the table in supine position taking care to pad all bony prominences. After a preincinduction time out anesthesia was induced. The left upper extremity was prepped and draped in normal sterile fashion and a pre-incision timeout was performed. He received ancef for preoperative antibiotics.   I exsanguinated the extremity and tourniquet was inflated  I made a transverse incision over his dorsal extensor compartments over the palpable ganglion I protected neurovascular structures including dorsal sensory nerve branch  Identified the ganglion I shelled this out to its stalk in roughly the fourth dorsal compartment.  I excised the ganglion here it had gelatinous ganglion fluid.  I opened the stalk site to prevent further recurrence  I  examined the wound all neurovascular structures were protected  Thorough irrigation was performed and I closed with a nylon stitch sterile dressing was applied he is taken the PACU in stable condition  POST OPERATIVE PLAN: Mobilize for DVT prophylaxis dressings full-time

## 2021-11-06 NOTE — Anesthesia Procedure Notes (Signed)
Procedure Name: Intubation Date/Time: 11/06/2021 9:12 AM  Performed by: Victoriano Lain, CRNAPre-anesthesia Checklist: Patient identified, Emergency Drugs available, Suction available, Patient being monitored and Timeout performed Patient Re-evaluated:Patient Re-evaluated prior to induction Oxygen Delivery Method: Circle system utilized Preoxygenation: Pre-oxygenation with 100% oxygen Induction Type: Rapid sequence, Cricoid Pressure applied and IV induction Laryngoscope Size: Mac and 4 Grade View: Grade I Tube type: Oral Tube size: 7.5 mm Number of attempts: 1 Airway Equipment and Method: Stylet Placement Confirmation: ETT inserted through vocal cords under direct vision, positive ETCO2 and breath sounds checked- equal and bilateral Secured at: 22 cm Tube secured with: Tape Dental Injury: Teeth and Oropharynx as per pre-operative assessment  Comments: Smooth RSI with cricoid pressure by Dr Ola Spurr. Grade 1 view. ATOI. ETT secured at 22 cm

## 2021-11-06 NOTE — Interval H&P Note (Signed)
History and Physical Interval Note:  11/06/2021 7:01 AM  Dakota Meyer  has presented today for surgery, with the diagnosis of LEFT WRIST GANGLION CYST.  The various methods of treatment have been discussed with the patient and family. After consideration of risks, benefits and other options for treatment, the patient has consented to  Procedure(s): REMOVAL GANGLION OF WRIST (Left) as a surgical intervention.  The patient's history has been reviewed, patient examined, no change in status, stable for surgery.  I have reviewed the patient's chart and labs.  Questions were answered to the patient's satisfaction.     Renette Butters

## 2021-11-06 NOTE — Transfer of Care (Signed)
Immediate Anesthesia Transfer of Care Note  Patient: Dakota Meyer  Procedure(s) Performed: REMOVAL GANGLION OF WRIST (Left: Wrist)  Patient Location: PACU  Anesthesia Type:General  Level of Consciousness: awake, alert , oriented and patient cooperative  Airway & Oxygen Therapy: Patient Spontanous Breathing and Patient connected to face mask oxygen  Post-op Assessment: Report given to RN, Post -op Vital signs reviewed and stable and Patient moving all extremities  Post vital signs: Reviewed and stable  Last Vitals:  Vitals Value Taken Time  BP    Temp    Pulse 87 11/06/21 1002  Resp 15 11/06/21 1002  SpO2 100 % 11/06/21 1002  Vitals shown include unvalidated device data.  Last Pain: There were no vitals filed for this visit.       Complications: No notable events documented.

## 2021-11-07 ENCOUNTER — Encounter (HOSPITAL_COMMUNITY): Payer: Self-pay | Admitting: Orthopedic Surgery

## 2021-11-12 ENCOUNTER — Other Ambulatory Visit: Payer: Self-pay | Admitting: Cardiology

## 2021-11-22 ENCOUNTER — Telehealth: Payer: Self-pay | Admitting: Pharmacist

## 2021-11-22 NOTE — Telephone Encounter (Signed)
Called pt to see how he was going on Ozempic '2mg'$ . LVM to call back.

## 2021-12-10 NOTE — Telephone Encounter (Signed)
Pt called on Friday asking if there was a higher dose of ozmepic. Called pt back. No answer, VM full.

## 2021-12-13 MED ORDER — OZEMPIC (2 MG/DOSE) 8 MG/3ML ~~LOC~~ SOPN: 2.0000 mg | PEN_INJECTOR | SUBCUTANEOUS | 11 refills | Status: DC

## 2021-12-13 NOTE — Telephone Encounter (Signed)
Patient called stating his pharmacy said he needs Rx for '2mg'$  pen, even though I sent an rx with a years of refills on it. I sent rx again. Patient has missed a dose already.

## 2021-12-27 MED ORDER — SEMAGLUTIDE(0.25 OR 0.5MG/DOS) 2 MG/3ML ~~LOC~~ SOPN: PEN_INJECTOR | SUBCUTANEOUS | 1 refills | Status: DC

## 2021-12-27 NOTE — Telephone Encounter (Signed)
Patient has been without ozempic for 4 weeks. Will restart titration. 0.'25mg'$  x 4 weeks, 0.'5mg'$  x 6 weeks then '1mg'$ . Rx sent.

## 2021-12-27 NOTE — Addendum Note (Signed)
Addended by: Marcelle Overlie D on: 12/27/2021 04:20 PM   Modules accepted: Orders

## 2022-01-02 NOTE — Progress Notes (Deleted)
HPI: Dakota Meyer is a 66 y.o. male patient referred to pharmacy clinic by Dr. Marland Kitchen to initiate weight loss therapy with GLP1-RA.  Most recent BMI ***. Look at the phone notes from 11/22/2021 Stop medication x 7 days for surgical procedures requiring anesthesia  Significant medical history:                  *** If diabetic and on insulin/sulfonylurea, can consider reducing dose to reduce risk of hypoglycemia  *** Follow-up visit  Assess % weight loss Assess adverse effects Missed doses  Current weight management medications:   Previously tried meds:   Current meds that may affect weight:   Baseline weight/BMI:   Insurance payor:   Diet:  -Breakfast: -Lunch: -Dinner: -Snacks: -Drinks:  {diet history for weight loss:28257}  Exercise:  {types:28256}  Family History:   Confirmed patient not ***pregnant and no personal or family history of medullary thyroid carcinoma (MTC) or Multiple Endocrine Neoplasia syndrome type 2 (MEN 2).   Social History:   Labs: Lab Results  Component Value Date   HGBA1C 5.0 06/23/2020    Wt Readings from Last 1 Encounters:  11/06/21 (!) 384 lb (174.2 kg)    BP Readings from Last 1 Encounters:  11/06/21 114/63   Pulse Readings from Last 1 Encounters:  11/06/21 73       Component Value Date/Time   CHOL 170 04/10/2020 1558   TRIG 182 (H) 04/10/2020 1558   HDL 43 04/10/2020 1558   CHOLHDL 4.0 04/10/2020 1558   [place] 96 04/10/2020 1558    Past Medical History:  Diagnosis Date   Anxiety    Asthma    as a child   Chronic knee pain    Depression    Headache    migraines with aura   Hypertension    Leukemia ([place]) 1997   Morbid obesity ([place])    Osteoarthritis 1998   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 Burt Knack and Dr. Cyndia Bent.    Sleep apnea    no CPAP use   Tinnitus aurium    Venous stasis     Current  Outpatient Medications on File Prior to Visit  Medication Sig Dispense Refill   acetaminophen (TYLENOL) 500 MG tablet Take 1 tablet (500 mg total) by mouth every 6 (six) hours as needed for mild pain or moderate pain. 60 tablet 0   amLODipine (NORVASC) 5 MG tablet Take 5 mg by mouth daily.     amoxicillin (AMOXIL) 500 MG tablet Take 4 tablets (2,000 mg total) one hour prior to all dental visits. 8 tablet 12   aspirin EC 81 MG tablet Take 1 tablet (81 mg total) by mouth daily. Swallow whole. 90 tablet 3   carvedilol (COREG) 12.5 MG tablet TAKE 1 TABLET(12.5 MG) BY MOUTH TWICE DAILY (Patient taking differently: Take 12.5 mg by mouth 2 (two) times daily with a meal.) 180 tablet 2   Cod Liver Oil OIL Take 1 capsule by mouth daily. 375 mg vitamin A 415 mg cod liver oil     diazepam (VALIUM) 10 MG tablet Take 10 mg by mouth daily as needed for anxiety or sleep.     diclofenac Sodium (VOLTAREN) 1 % GEL Apply 2 g topically daily as needed (pain).     DULoxetine (CYMBALTA) 60 MG capsule Take 60 mg by mouth daily.     gabapentin (NEURONTIN) 300  MG capsule Take 1 capsule (300 mg total) by mouth 2 (two) times daily for 14 days. For 2 weeks post op for pain. 28 capsule 0   losartan (COZAAR) 100 MG tablet Take 100 mg by mouth daily.     meloxicam (MOBIC) 15 MG tablet Take 15 mg by mouth daily as needed for pain.     Omega-3 Fatty Acids (FISH OIL) 1200 MG CPDR Take 1,200 mg by mouth daily.     OVER THE COUNTER MEDICATION Take 1 capsule by mouth daily. Go-Out     oxyCODONE-acetaminophen (PERCOCET) 10-325 MG tablet Take 2 tablets by mouth 3 (three) times daily as needed for pain.     Semaglutide,0.25 or 0.'5MG'$ /DOS, 2 MG/3ML SOPN Inject 0.'25mg'$  once a week for 4 weeks then increase to 0.'5mg'$  weekly 3 mL 1   tamsulosin (FLOMAX) 0.4 MG CAPS capsule Take 0.4 mg by mouth daily.     testosterone cypionate (DEPOTESTOTERONE CYPIONATE) 100 MG/ML injection Inject 100 mg into the muscle once a week.     No current  facility-administered medications on file prior to visit.    No Known Allergies  Assessment/Plan   No problem-specific Assessment & Plan notes found for this encounter.  '@MTPCOMPLETEDLIST'$ @  {f/u with PharmD:28259}  Tommy Medal PharmD CPP [facility] [address] [place], [place] [address]

## 2022-01-03 ENCOUNTER — Ambulatory Visit: Payer: Medicare Other

## 2022-01-03 NOTE — Progress Notes (Deleted)
Patient ID: Dakota Meyer                 DOB: May 21, 1955                    MRN: [id number]      HPI: Dakota Meyer is a 66 y.o. male patient referred to pharmacy clinic by Nell Range, PA to initiate weight loss therapy with GLP1-RA. Patient has advanced knee OA, but not candidate for surgery until loses wight. He does not have the ability to exercise to help with weight loss. 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 Most recent BMI 55.81. Most recent BMI ***, previous BMI ***.  Patient started ozempic in 06/2021. Was not able to take for the last four weeks and had to restart titration over on 11/2. Patient presents to clinic***  *** Follow-up visit  Assess % weight loss Assess adverse effects Missed doses  Current weight management medications:  -Ozempic 0.25 mg subq once weekly  Previously tried meds: N/A  Current meds that may affect weight:   Baseline weight/BMI: 423/ 54.3  Current weight/BMI:  Insurance payor: [employer] medicare   Diet:  -Breakfast: -Lunch: -Dinner: -Snacks: -Drinks:  Exercise: none due to arthritis, leg lifts, arm curls (**re-assess)  Family History:  Family History  Problem Relation Age of Onset   Cancer Mother        thyroid ca   Aneurysm Mother    Cancer Father        lung   Social History: ** Assess Alc use** Labs: Lab Results  Component Value Date   HGBA1C 5.0 06/23/2020    Wt Readings from Last 1 Encounters:  11/06/21 (!) 384 lb (174.2 kg)    BP Readings from Last 1 Encounters:  11/06/21 114/63   Pulse Readings from Last 1 Encounters:  11/06/21 73       Component Value Date/Time   CHOL 170 04/10/2020 1558   TRIG 182 (H) 04/10/2020 1558   HDL 43 04/10/2020 1558   CHOLHDL 4.0 04/10/2020 1558   [place] 96 04/10/2020 1558    Past Medical History:  Diagnosis Date    Anxiety    Asthma    as a child   Chronic knee pain    Depression    Headache    migraines with aura   Hypertension    Leukemia ([place]) 1997   Morbid obesity ([place])    Osteoarthritis 1998   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 Burt Knack and Dr. Cyndia Bent.    Sleep apnea    no CPAP use   Tinnitus aurium    Venous stasis     Current Outpatient Medications on File Prior to Visit  Medication Sig Dispense Refill   acetaminophen (TYLENOL) 500 MG tablet Take 1 tablet (500 mg total) by mouth every 6 (six) hours as needed for mild pain or moderate pain. 60 tablet 0   amLODipine (NORVASC) 5 MG tablet Take 5 mg by mouth daily.     amoxicillin (AMOXIL) 500 MG tablet Take 4 tablets (2,000 mg total) one hour prior to all dental visits. 8 tablet 12   aspirin EC 81 MG tablet Take 1 tablet (81 mg total) by mouth daily. Swallow whole. 90 tablet 3  carvedilol (COREG) 12.5 MG tablet TAKE 1 TABLET(12.5 MG) BY MOUTH TWICE DAILY (Patient taking differently: Take 12.5 mg by mouth 2 (two) times daily with a meal.) 180 tablet 2   Cod Liver Oil OIL Take 1 capsule by mouth daily. 375 mg vitamin A 415 mg cod liver oil     diazepam (VALIUM) 10 MG tablet Take 10 mg by mouth daily as needed for anxiety or sleep.     diclofenac Sodium (VOLTAREN) 1 % GEL Apply 2 g topically daily as needed (pain).     DULoxetine (CYMBALTA) 60 MG capsule Take 60 mg by mouth daily.     gabapentin (NEURONTIN) 300 MG capsule Take 1 capsule (300 mg total) by mouth 2 (two) times daily for 14 days. For 2 weeks post op for pain. 28 capsule 0   losartan (COZAAR) 100 MG tablet Take 100 mg by mouth daily.     meloxicam (MOBIC) 15 MG tablet Take 15 mg by mouth daily as needed for pain.     Omega-3 Fatty Acids (FISH OIL) 1200 MG CPDR Take 1,200 mg by mouth daily.     OVER THE COUNTER MEDICATION Take 1 capsule by mouth daily. Go-Out     oxyCODONE-acetaminophen  (PERCOCET) 10-325 MG tablet Take 2 tablets by mouth 3 (three) times daily as needed for pain.     Semaglutide,0.25 or 0.5MG/DOS, 2 MG/3ML SOPN Inject 0.27m once a week for 4 weeks then increase to 0.554mweekly 3 mL 1   tamsulosin (FLOMAX) 0.4 MG CAPS capsule Take 0.4 mg by mouth daily.     testosterone cypionate (DEPOTESTOTERONE CYPIONATE) 100 MG/ML injection Inject 100 mg into the muscle once a week.     No current facility-administered medications on file prior to visit.    No Known Allergies   Assessment/Plan:  1. Weight loss - Patient has not met goal of at least 5% of body weight loss with comprehensive lifestyle modifications alone in the past 3-6 months. Pharmacotherapy is appropriate to pursue as augmentation. Will start ***. Confirmed patient not ***pregnant and no personal or family history of medullary thyroid carcinoma (MTC) or Multiple Endocrine Neoplasia syndrome type 2 (MEN 2).   Advised patient on common side effects including nausea, diarrhea, dyspepsia, decreased appetite, and fatigue. Counseled patient on reducing meal size and how to titrate medication to minimize side effects. Counseled patient to call if intolerable side effects or if experiencing dehydration, abdominal pain, or dizziness. Patient will adhere to dietary modifications and will target at least 150 minutes of moderate intensity exercise weekly.   Follow up in ***.

## 2022-01-23 ENCOUNTER — Telehealth: Payer: Self-pay | Admitting: Pharmacist

## 2022-01-23 NOTE — Telephone Encounter (Signed)
Patient called and LVM that he missed his apt with me and needed to reschedule. Called pt and LVM for him to call back.

## 2022-01-27 NOTE — Progress Notes (Signed)
Cardiology Office Note:    Date:  01/28/2022   ID:  Dakota, Meyer 07-13-1955, MRN [id number]  PCP:  Candi Leash, PA-C  Cardiologist:  Donato Heinz, MD  Electrophysiologist:  None   Referring MD: Candi Leash, PA-C   Chief Complaint  Patient presents with   Congestive Heart Failure    History of Present Illness:    Dakota Meyer is a 66 y.o. male with a hx of chronic combined systolic and diastolic heart failure, aortic stenosis, leukemia, hypertension, hyperlipidemia, depression who presents for follow-up.  He was referred by Candi Leash, PA for evaluation of systolic heart failure, initially seen on 03/27/2020.  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  He reports that he has been having dyspnea with minimal exertion.  Denies any chest pain.  Denies any lighthheadedness or syncope.  Does report he has been having lower extremity edema, uses compression stockings.  Reports rare palpitations where he feels like heart is fluttering.  Has been told he has OSA but does not use CPAP.  No smoking history.  Family history includes father had CABG in 76s.  Echocardiogram 11/03/2019 was poor quality study but showed LVEF 35 to 06%, grade 1 diastolic dysfunction, normal RV function, moderate aortic stenosis.  Echocardiogram on 05/16/2020 showed LVEF 30 to 26%, grade 1 diastolic dysfunction, normal RV function, mild AI, moderate to severe AS (Vmax 4.3 m/s, MG 55mHg, AVA 1.6 cm^2).  He underwent TAVR with 29 mm Edwards SAPIEN 3 valve 06/27/2020.  Postop echo showed EF 35 to 40%, no perivalvular leak, aortic valve mean gradient 20 mmHg (likely elevated due to patient prosthesis mismatch due to body habitus).  Echo 06/29/2021 showed EF 55 to 60%, normal RV function, mild perivalvular leak, mean gradient 20 mmHg.  Since last clinic visit, he reports has been doing okay.  States that he has lost 40 pounds since starting Ozempic.  Reports occasional chest pain when he lies down in a certain  way.  Does report having dyspnea with exertion.  Reports some lightheadedness but denies any syncope.  Continues to have swelling in his legs.  Denies any palpitations.  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 CBurt Knackand Dr. BCyndia Bent    Sleep apnea    no CPAP use   Tinnitus aurium    Venous stasis       Current Medications: Current Meds  Medication Sig   acetaminophen (TYLENOL) 500 MG tablet Take 1 tablet (500 mg total) by mouth every 6 (six) hours as needed for mild pain or moderate pain.   amLODipine (NORVASC) 5 MG tablet Take 5 mg by mouth daily.   amoxicillin (AMOXIL) 500 MG tablet Take 4 tablets (2,000 mg total) one hour prior to all dental  visits.   aspirin EC 81 MG tablet Take 1 tablet (81 mg total) by mouth daily. Swallow whole.   carvedilol (COREG) 12.5 MG tablet TAKE 1 TABLET(12.5 MG) BY MOUTH TWICE DAILY (Patient taking differently: Take 12.5 mg by mouth 2 (two) times daily with a meal.)   Cod Liver Oil OIL Take 1 capsule by mouth daily. 375 mg vitamin A 415 mg cod liver oil   diazepam (VALIUM) 10 MG tablet Take 10 mg by mouth daily as needed for anxiety or sleep.   diclofenac Sodium (VOLTAREN) 1 % GEL Apply 2 g topically daily as needed (pain).   DULoxetine (CYMBALTA) 60 MG capsule Take 60 mg by mouth daily.   losartan (COZAAR) 100 MG tablet Take 100 mg by mouth daily.   meloxicam (MOBIC) 15 MG tablet Take 15 mg by  mouth daily as needed for pain.   Omega-3 Fatty Acids (FISH OIL) 1200 MG CPDR Take 1,200 mg by mouth daily.   OVER THE COUNTER MEDICATION Take 1 capsule by mouth daily. Go-Out   oxyCODONE-acetaminophen (PERCOCET) 10-325 MG tablet Take 2 tablets by mouth 3 (three) times daily as needed for pain.   Semaglutide,0.25 or 0.'5MG'$ /DOS, 2 MG/3ML SOPN Inject 0.'25mg'$  once a week for 4 weeks then increase to 0.'5mg'$  weekly   tamsulosin (FLOMAX) 0.4 MG CAPS capsule Take 0.4 mg by mouth daily.   testosterone cypionate (DEPOTESTOTERONE CYPIONATE) 100 MG/ML injection Inject 100 mg into the muscle once a week.     Allergies:   Patient has no known allergies.   Social History   Socioeconomic History   Marital status: Married    Spouse name: Not on file   Number of children: Not on file   Years of education: Not on file   Highest education level: Not on file  Occupational History   Not on file  Tobacco Use   Smoking status: Never   Smokeless tobacco: Never  Vaping Use   Vaping Use: Never used  Substance and Sexual Activity   Alcohol use: Yes    Comment: occasionally   Drug use: No   Sexual activity: Not Currently  Other Topics Concern   Not on file  Social History Narrative   Not on file   Social Determinants of Health   Financial Resource Strain: Not on file  Food Insecurity: Not on file  Transportation Needs: Not on file  Physical Activity: Not on file  Stress: Not on file  Social Connections: Not on file     Family History: The patient's family history includes Aneurysm in his mother; Cancer in his father and mother.  ROS:   Please see the history of present illness.     All other systems reviewed and are negative.  EKGs/Labs/Other Studies Reviewed:    The following studies were reviewed today:   EKG:  EKG is not ordered today.   Recent Labs: 10/24/2021: BUN 15; Creatinine, Ser 1.10; Hemoglobin 13.7; Platelets 240; Potassium 3.7; Sodium 143  Recent Lipid Panel    Component  Value Date/Time   CHOL 170 04/10/2020 1558   TRIG 182 (H) 04/10/2020 1558   HDL 43 04/10/2020 1558   CHOLHDL 4.0 04/10/2020 1558   LDLCALC 96 04/10/2020 1558    Physical Exam:    VS:  BP (!) 144/96 (BP Location: Left Arm, Patient Position: Sitting, Cuff Size: Large)   Pulse 89   Ht '6\' 1"'$  (1.854 m)   Wt (!) 381 lb (172.8 kg)   SpO2 95%   BMI 50.27  kg/m     Wt Readings from Last 3 Encounters:  01/28/22 (!) 381 lb (172.8 kg)  11/06/21 (!) 384 lb (174.2 kg)  10/24/21 (!) 384 lb (174.2 kg)     GEN:  in no acute distress HEENT: Normal NECK: No JVD appreciated but difficult to assess given body habitus CARDIAC: RRR, 2 out of 6 systolic murmur loudest at RUSB RESPIRATORY:  Clear to auscultation without rales, wheezing or rhonchi  ABDOMEN: Soft, non-tender, non-distended MUSCULOSKELETAL:  No edema; No deformity  SKIN: Warm and dry NEUROLOGIC:  Alert and oriented x 3 PSYCHIATRIC:  Normal affect   ASSESSMENT:    1. Chronic combined systolic (congestive) and diastolic (congestive) heart failure ([place])   2. S/P TAVR (transcatheter aortic valve replacement)   3. OSA (obstructive sleep apnea)   4. Essential hypertension     PLAN:    Chronic combined systolic and diastolic heart failure: Echocardiogram 11/03/2019 was poor quality study but showed LVEF 35 to 76%, grade 1 diastolic dysfunction, normal RV function, moderate aortic stenosis.  He reports dyspnea with minimal exertion.  Repeat echocardiogram was done on 05/16/2020, with contrast given, and showed LVEF 30 to 35%, normal RV function, moderate to severe aortic stenosis.  LHC 05/25/2020 showed normal coronary arteries.  Echo 06/29/2021 showed EF 55 to 60%, normal RV function, mild perivalvular leak, mean gradient 20 mmHg. -Continue losartan 100 mg daily -Continue carvedilol 12.5 mg twice daily  Aortic stenosis: Moderate on echo 11/03/2019, but poor quality study.  Repeat echo 05/16/20 showed moderate to severe AS (Vmax 4.3 m/s, MG  43mHg, AVA 1.6 cm^2).  Suspect AVA is overestimated as LVOT diameter is measured at 2.7cm; at LVOT diameter 2.0 cm and VTI 24cm, AVA would be 0.9 cm^2.  Suspect severe AS.  Referred to valve clinic and underwent TAVR with 29 mm Edwards SAPIEN 3 valve 06/27/2020.  Postop echo showed EF 35 to 40%, no perivalvular leak, aortic valve mean gradient 20 mmHg (likely elevated due to patient prosthesis mismatch due to body habitus).  Echo 06/29/2021 showed EF 55 to 60%, normal RV function, mild perivalvular leak, mean gradient 20 mmHg.  OSA: Reports was diagnosed years ago but not on CPAP.  Suspect nontreated OSA contributing to resistant hypertension.  Will check sleep study  Hypertension: On carvedilol, amlodipine, losartan.  -Suspect untreated OSA contributing to resistant hypertension, check sleep study as above. -BP elevated in clinic today.  He is unsure if he is taking his medications as prescribed.  Asked him to check his medications when he gets home and let [place] what [place] is taking.  Asked him to check BP twice daily for next week and call with results  Hyperlipidemia: On atorvastatin 20 mg daily.  LDL 96 on 04/10/2020   RTC in 6 months   Medication Adjustments/Labs and Tests Ordered: Current medicines are reviewed at length with the patient today.  Concerns regarding medicines are outlined above.  Orders Placed This Encounter  Procedures   Itamar Sleep Study   No orders of the defined types were placed in this encounter.   Patient Instructions  Medication Instructions:  When you get home, please confirm you are taking:  Aspirin 81 mg daily Amlodipine 5 mg daily Carvedilol (coreg) 12.5 mg two times daily Losartan 100 mg daily   Please check your blood pressure at home twice daily, write it down.  Call the office or send message via Mychart with the readings in 1 week for Dr. SGardiner Rhymeto review.  Testing/Procedures: WatchPAT?  Is a FDA cleared portable home sleep study test that  uses a watch and 3 points of contact to monitor 7 different channels, including your heart rate, oxygen saturations, body position, snoring, and chest motion.  The study is easy to use from the comfort of your own home and accurately detect sleep apnea.  Before bed, you attach the chest sensor, attached the sleep apnea bracelet to your nondominant hand, and attach the finger probe.  After the study, the raw data is downloaded from the watch and scored for apnea events.   For more information: [email or web address]  Patient Testing Instructions:  Do not put battery into the device until bedtime when you are ready to begin the test. Please call the support number if you need assistance after following the instructions below: 24 hour support line- [phone] or ITAMAR support at [phone] (option 2)  Download the The First AmericanWatchPAT One" app through the google play store or App Store  Be sure to turn on or enable access to bluetooth in settlings on your smartphone/ device  Make sure no other bluetooth devices are on and within the vicinity of your smartphone/ device and WatchPAT watch during testing.  Make sure to leave your smart phone/ device plugged in and charging all night.  When ready for bed:  Follow the instructions step by step in the WatchPAT One App to activate the testing device. For additional instructions, including video instruction, visit the WatchPAT One video on Youtube. You can search for [place] One within Youtube (video is 4 minutes and 18 seconds) or enter: https://youtube/watch?[email or web address]_[email or web address] Please note: You will be prompted to enter a Pin to connect via bluetooth when starting the test. The PIN will be assigned to you when you receive the test.  The device is disposable, but it recommended that you retain the device until you receive a call letting you know the study has been received and the results have been interpreted.  We will let you know if the study  did not transmit to [place] properly after the test is completed. You do not need to call us to confirm the receipt of the test.  Please complete the test within 48 hours of receiving PIN.   Frequently Asked Questions:  What is Watch [place] one?  A single use fully disposable home sleep apnea testing device and will not need to be returned after completion.  What are the requirements to use WatchPAT one?  The be able to have a successful watchpat one sleep study, you should have your Watch pat one device, your smart phone, watch pat one app, your PIN number and Internet access What type of phone do I need?  You should have a smart phone that uses Android 5.1 and above or any Iphone with IOS 10 and above How can I download the WatchPAT one app?  Based on your device type search for WatchPAT one app either in google play for android devices or APP store for Iphone's Where will I get my PIN for the study?  Your PIN will be provided by your physician's office. It is used for authentication and if you lose/forget your PIN, please reach out to your providers office.  I do not have Internet at home. Can I do WatchPAT one study?  WatchPAT One needs Internet connection throughout the night to be able to transmit the sleep data. You can use your home/local internet or your cellular's data package. However, it is always recommended to use home/local  Internet. It is estimated that between 20MB-30MB will be used with each study.However, the application will be looking for 80MB space in the phone to start the study.  What happens if I lose internet or bluetooth connection?  During the internet disconnection, your phone will not be able to transmit the sleep data. All the data, will be stored in your phone. As soon as the internet connection is back on, the phone will being sending the sleep data. During the bluetooth disconnection, WatchPAT one will not be able to to send the sleep data to your phone. Data will be kept  in the [facility] one until two devices have bluetooth connection back on. As soon as the connection is back on, WatchPAT one will send the sleep data to the phone.  How long do I need to wear the WatchPAT one?  After you start the study, you should wear the device at least 6 hours.  How far should I keep my phone from the device?  During the night, your phone should be within 15 feet.  What happens if I leave the room for restroom or other reasons?  Leaving the room for any reason will not cause any problem. As soon as your get back to the room, both devices will reconnect and will continue to send the sleep data. Can I use my phone during the sleep study?  Yes, you can use your phone as usual during the study. But it is recommended to put your watchpat one on when you are ready to go to bed.  How will I get my study results?  A soon as you completed your study, your sleep data will be sent to the provider. They will then share the results with you when they are ready.    Follow-Up: At [facility], you and your health needs are our priority.  As part of our continuing mission to provide you with exceptional heart care, we have created designated Provider Care Teams.  These Care Teams include your primary Cardiologist (physician) and Advanced Practice Providers (APPs -  Physician Assistants and Nurse Practitioners) who all work together to provide you with the care you need, when you need it.  We recommend signing up for the patient portal called "MyChart".  Sign up information is provided on this After Visit Summary.  MyChart is used to connect with patients for Virtual Visits (Telemedicine).  Patients are able to view lab/test results, encounter notes, upcoming appointments, etc.  Non-urgent messages can be sent to your provider as well.   To learn more about what you can do with MyChart, go to [email or web address].    Your next appointment:   6 month(s)  The format for your next  appointment:   In Person  Provider:   Donato Heinz, MD         Signed, Donato Heinz, MD  01/28/2022 5:26 PM    [place]

## 2022-01-28 ENCOUNTER — Ambulatory Visit: Payer: Medicare Other | Attending: Cardiology | Admitting: Cardiology

## 2022-01-28 ENCOUNTER — Encounter: Payer: Self-pay | Admitting: Cardiology

## 2022-01-28 VITALS — BP 144/96 | HR 89 | Ht 73.0 in | Wt 381.0 lb

## 2022-01-28 DIAGNOSIS — I10 Essential (primary) hypertension: Secondary | ICD-10-CM | POA: Diagnosis not present

## 2022-01-28 DIAGNOSIS — Z952 Presence of prosthetic heart valve: Secondary | ICD-10-CM

## 2022-01-28 DIAGNOSIS — I5042 Chronic combined systolic (congestive) and diastolic (congestive) heart failure: Secondary | ICD-10-CM | POA: Diagnosis not present

## 2022-01-28 DIAGNOSIS — G4733 Obstructive sleep apnea (adult) (pediatric): Secondary | ICD-10-CM

## 2022-01-28 NOTE — Patient Instructions (Signed)
Medication Instructions:  When you get home, please confirm you are taking:  Aspirin 81 mg daily Amlodipine 5 mg daily Carvedilol (coreg) 12.5 mg two times daily Losartan 100 mg daily   Please check your blood pressure at home twice daily, write it down.  Call the office or send message via Mychart with the readings in 1 week for Dr. Gardiner Rhyme to review.    Testing/Procedures: WatchPAT?  Is a FDA cleared portable home sleep study test that uses a watch and 3 points of contact to monitor 7 different channels, including your heart rate, oxygen saturations, body position, snoring, and chest motion.  The study is easy to use from the comfort of your own home and accurately detect sleep apnea.  Before bed, you attach the chest sensor, attached the sleep apnea bracelet to your nondominant hand, and attach the finger probe.  After the study, the raw data is downloaded from the watch and scored for apnea events.   For more information: [email or web address]  Patient Testing Instructions:  Do not put battery into the device until bedtime when you are ready to begin the test. Please call the support number if you need assistance after following the instructions below: 24 hour support line- [phone] or ITAMAR support at [phone] (option 2)  Download the The First AmericanWatchPAT One" app through the google play store or App Store  Be sure to turn on or enable access to bluetooth in settlings on your smartphone/ device  Make sure no other bluetooth devices are on and within the vicinity of your smartphone/ device and WatchPAT watch during testing.  Make sure to leave your smart phone/ device plugged in and charging all night.  When ready for bed:  Follow the instructions step by step in the WatchPAT One App to activate the testing device. For additional instructions, including video instruction, visit the WatchPAT One video on Youtube. You can search for [place] One within Youtube  (video is 4 minutes and 18 seconds) or enter: https://youtube/watch?[email or web address]_vbiwxE Please note: You will be prompted to enter a Pin to connect via bluetooth when starting the test. The PIN will be assigned to you when you receive the test.  The device is disposable, but it recommended that you retain the device until you receive a call letting you know the study has been received and the results have been interpreted.  We will let you know if the study did not transmit to [place] properly after the test is completed. You do not need to call us to confirm the receipt of the test.  Please complete the test within 48 hours of receiving PIN.   Frequently Asked Questions:  What is Watch [place] one?  A single use fully disposable home sleep apnea testing device and will not need to be returned after completion.  What are the requirements to use WatchPAT one?  The be able to have a successful watchpat one sleep study, you should have your Watch pat one device, your smart phone, watch pat one app, your PIN number and Internet access What type of phone do I need?  You should have a smart phone that uses Android 5.1 and above or any Iphone with IOS 10 and above How can I download the WatchPAT one app?  Based on your device type search for WatchPAT one app either in google play for android devices or APP store for Iphone's Where will I get my PIN for the study?  Your PIN will be provided by your physician's office.  It is used for authentication and if you lose/forget your PIN, please reach out to your providers office.  I do not have Internet at home. Can I do WatchPAT one study?  WatchPAT One needs Internet connection throughout the night to be able to transmit the sleep data. You can use your home/local internet or your cellular's data package. However, it is always recommended to use home/local Internet. It is estimated that between 20MB-30MB will be used with each study.However, the application will be looking for  80MB space in the phone to start the study.  What happens if I lose internet or bluetooth connection?  During the internet disconnection, your phone will not be able to transmit the sleep data. All the data, will be stored in your phone. As soon as the internet connection is back on, the phone will being sending the sleep data. During the bluetooth disconnection, WatchPAT one will not be able to to send the sleep data to your phone. Data will be kept in the [facility] one until two devices have bluetooth connection back on. As soon as the connection is back on, WatchPAT one will send the sleep data to the phone.  How long do I need to wear the WatchPAT one?  After you start the study, you should wear the device at least 6 hours.  How far should I keep my phone from the device?  During the night, your phone should be within 15 feet.  What happens if I leave the room for restroom or other reasons?  Leaving the room for any reason will not cause any problem. As soon as your get back to the room, both devices will reconnect and will continue to send the sleep data. Can I use my phone during the sleep study?  Yes, you can use your phone as usual during the study. But it is recommended to put your watchpat one on when you are ready to go to bed.  How will I get my study results?  A soon as you completed your study, your sleep data will be sent to the provider. They will then share the results with you when they are ready.    Follow-Up: At [facility], you and your health needs are our priority.  As part of our continuing mission to provide you with exceptional heart care, we have created designated Provider Care Teams.  These Care Teams include your primary Cardiologist (physician) and Advanced Practice Providers (APPs -  Physician Assistants and Nurse Practitioners) who all work together to provide you with the care you need, when you need it.  We recommend signing up for the patient portal  called "MyChart".  Sign up information is provided on this After Visit Summary.  MyChart is used to connect with patients for Virtual Visits (Telemedicine).  Patients are able to view lab/test results, encounter notes, upcoming appointments, etc.  Non-urgent messages can be sent to your provider as well.   To learn more about what you can do with MyChart, go to [email or web address].    Your next appointment:   6 month(s)  The format for your next appointment:   In Person  Provider:   Donato Heinz, MD

## 2022-01-29 ENCOUNTER — Telehealth: Payer: Self-pay | Admitting: *Deleted

## 2022-01-29 ENCOUNTER — Other Ambulatory Visit: Payer: Self-pay | Admitting: Cardiology

## 2022-01-29 NOTE — Telephone Encounter (Signed)
Staff message sent to Dakota Meyer ok to activate itamar deivce.

## 2022-01-31 ENCOUNTER — Telehealth: Payer: Self-pay

## 2022-01-31 NOTE — Telephone Encounter (Signed)
I called the patient to schedule a pickup for a Itamar device. I was unable to speak with the patient, I LVM for patient to return my call.

## 2022-02-01 ENCOUNTER — Telehealth: Payer: Self-pay

## 2022-02-01 NOTE — Telephone Encounter (Signed)
I CALLED THE PATIENT TO SPEAK WITH HIM ABOUT SCHEDULING AN APPOINTMENT TO PICK UP AN ITAMAR DEVICE. UNABLE TO REACH THE PATIENT, LVM FOR PATIENT TO RETURN MY CALL.

## 2022-02-07 NOTE — Telephone Encounter (Signed)
Left voice message asking patient to give myself or Marzetta Board a call back to set up an appointment to pick up his Itamar sleep watch

## 2022-02-11 NOTE — Telephone Encounter (Signed)
I called the patient to discuss with him about picking up a Itamar device, I was unable to reach the patient, I LVM for him to return my call.

## 2022-02-28 ENCOUNTER — Telehealth: Payer: Self-pay | Admitting: Pharmacist

## 2022-02-28 NOTE — Telephone Encounter (Signed)
Called pt and LVM for him to call back. Following up on ozempic and titration

## 2022-03-01 ENCOUNTER — Telehealth: Payer: Self-pay

## 2022-03-01 NOTE — Telephone Encounter (Signed)
I called the patient to speak with him about picking up an itamar device that was ordered, LVm for him to return my call

## 2022-03-08 MED ORDER — SEMAGLUTIDE (1 MG/DOSE) 4 MG/3ML ~~LOC~~ SOPN: 1.0000 mg | PEN_INJECTOR | SUBCUTANEOUS | 0 refills | Status: DC

## 2022-03-08 NOTE — Addendum Note (Signed)
Addended by: Palmira Stickle E on: 03/08/2022 03:19 PM   Modules accepted: Orders

## 2022-03-08 NOTE — Telephone Encounter (Signed)
Pt left message. Called him back. He finished his 0.'5mg'$  month of Ozempic and would like to increase to the next dose, tolerating well. Rx sent in for '1mg'$  weekly dose. Pt aware to call in another month and can send in for '2mg'$  maintenance dose pending tolerability.

## 2022-04-05 ENCOUNTER — Other Ambulatory Visit: Payer: Self-pay | Admitting: Physician Assistant

## 2022-04-05 ENCOUNTER — Telehealth: Payer: Self-pay | Admitting: Pharmacist

## 2022-04-05 NOTE — Telephone Encounter (Signed)
Left message for pt - will titrate up to 82m maintenance dose if he's tolerated [place] this past month.

## 2022-04-05 NOTE — Telephone Encounter (Signed)
Called pt to follow up with Ozempic tolerability and titration. Left message. Will titrate to 22m maintenance dose if he's tolerated 131mdose well this past month.

## 2022-04-08 MED ORDER — OZEMPIC (2 MG/DOSE) 8 MG/3ML ~~LOC~~ SOPN: 2.0000 mg | PEN_INJECTOR | SUBCUTANEOUS | 3 refills | Status: DC

## 2022-04-08 NOTE — Telephone Encounter (Signed)
Spoke to patient, he is tolerating 1 mg Ozempic well. He has lost 55 lbs since he started Ozempic treatment. His BG improving too. He wants to titrate the dose to the next level ( 2 mg every week).

## 2022-04-18 NOTE — Telephone Encounter (Signed)
Spoke with patient he has been on Ozmepic 48m and tolerating ok.

## 2022-05-07 ENCOUNTER — Telehealth: Payer: Self-pay | Admitting: Pharmacist

## 2022-05-07 NOTE — Telephone Encounter (Signed)
Patient called requesting refill on semaglutide 2 mg. Prescription was sent in feb with 3 refills. Patient informed.

## 2022-08-09 ENCOUNTER — Other Ambulatory Visit: Payer: Self-pay | Admitting: Pharmacist Clinician (PhC)/ Clinical Pharmacy Specialist

## 2022-08-09 MED ORDER — OZEMPIC (2 MG/DOSE) 8 MG/3ML ~~LOC~~ SOPN: 2.0000 mg | PEN_INJECTOR | SUBCUTANEOUS | 2 refills | Status: DC

## 2022-09-03 NOTE — Telephone Encounter (Signed)
This encounter was created in error - please disregard.

## 2022-09-18 ENCOUNTER — Other Ambulatory Visit: Payer: Self-pay | Admitting: Cardiology

## 2022-12-03 ENCOUNTER — Ambulatory Visit: Payer: Medicare Other | Attending: Cardiology | Admitting: Cardiology

## 2022-12-03 VITALS — BP 122/64 | HR 90 | Ht 73.0 in | Wt 360.2 lb

## 2022-12-03 DIAGNOSIS — I5042 Chronic combined systolic (congestive) and diastolic (congestive) heart failure: Secondary | ICD-10-CM

## 2022-12-03 DIAGNOSIS — G4733 Obstructive sleep apnea (adult) (pediatric): Secondary | ICD-10-CM

## 2022-12-03 DIAGNOSIS — Z952 Presence of prosthetic heart valve: Secondary | ICD-10-CM

## 2022-12-03 DIAGNOSIS — M79604 Pain in right leg: Secondary | ICD-10-CM

## 2022-12-03 DIAGNOSIS — M79605 Pain in left leg: Secondary | ICD-10-CM

## 2022-12-03 NOTE — Progress Notes (Signed)
Cardiology Office Note:    Date:  12/08/2022   ID:  Dari, Kamran 06-08-1955, MRN [id number]  PCP:  Elder Negus, PA-C  Cardiologist:  Little Ishikawa, MD  Electrophysiologist:  None   Referring MD: Elder Negus, PA-C   Chief Complaint  Patient presents with   Congestive Heart Failure    History of Present Illness:    Dakota Meyer is a 67 y.o. male with a hx of chronic combined systolic and diastolic heart failure, aortic stenosis, leukemia, hypertension, hyperlipidemia, depression who presents for follow-up.  He was referred by Elder Negus, PA for evaluation of systolic heart failure, initially seen on 03/27/2020.  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  He reports that he has been having dyspnea with minimal exertion.  Denies any chest pain.  Denies any lighthheadedness or syncope.  Does report he has been having lower extremity edema, uses compression stockings.  Reports rare palpitations where he feels like heart is fluttering.  Has been told he has OSA but does not use CPAP.  No smoking history.  Family history includes father had CABG in 50s.  Echocardiogram 11/03/2019 was poor quality study but showed LVEF 35 to 40%, grade 1 diastolic dysfunction, normal RV function, moderate aortic stenosis.  Echocardiogram on 05/16/2020 showed LVEF 30 to 35%, grade 1 diastolic dysfunction, normal RV function, mild AI, moderate to severe AS (Vmax 4.3 m/s, MG , AVA 1.6 cm^2).  He underwent TAVR with 29 mm Edwards SAPIEN 3 valve 06/27/2020.  Postop echo showed EF 35 to 40%, no perivalvular leak, aortic valve mean gradient 20 mmHg (likely elevated due to patient prosthesis mismatch due to body habitus).  Echo 06/29/2021 showed EF 55 to 60%, normal RV function, mild perivalvular leak, mean gradient 20 mmHg.  Since last clinic visit, reports he has been doing well.  He has lost 70 pounds since starting Ozempic.  Denies any dyspnea, lightheadedness, syncope, lower extremity edema, or  palpitations.  Reports intermittent chest pain which he describes as bruising feeling on left side of chest that occurs when he lies down.  Does not occur with exertion.  Also reports having pain in left calf, typically occurs at the end of day.  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 Excell Seltzer and Dr. Laneta Simmers.    Sleep apnea    no CPAP use   Tinnitus aurium    Venous stasis       Current Medications: Current Meds  Medication Sig   acetaminophen (TYLENOL) 500 MG tablet Take 1 tablet (500 mg total) by mouth every 6 (six) hours as needed for mild pain or moderate pain.   amLODipine (NORVASC) 5 MG tablet Take 5 mg by mouth daily.   aspirin EC 81 MG  tablet Take 1 tablet (81 mg total) by mouth daily. Swallow whole.   carvedilol (COREG) 12.5 MG tablet TAKE 1 TABLET(12.5 MG) BY MOUTH TWICE DAILY   Cod Liver Oil OIL Take 1 capsule by mouth daily. 375 mg vitamin A 415 mg cod liver oil   diazepam (VALIUM) 10 MG tablet Take 10 mg by mouth daily as needed for anxiety or sleep.   diclofenac Sodium (VOLTAREN) 1 % GEL Apply 2 g topically daily as needed (pain).   DULoxetine (CYMBALTA) 60 MG capsule Take 60 mg by mouth daily.   losartan (COZAAR) 100 MG tablet TAKE 1 TABLET BY MOUTH EVERY DAY   Omega-3 Fatty Acids (FISH OIL) 1200 MG CPDR Take 1,200 mg by mouth daily.   OVER THE COUNTER MEDICATION Take 1 capsule by mouth daily. Go-Out    oxyCODONE-acetaminophen (PERCOCET) 10-325 MG tablet Take 2 tablets by mouth 3 (three) times daily as needed for pain.   Semaglutide, 2 MG/DOSE, (OZEMPIC, 2 MG/DOSE,) 8 MG/3ML SOPN Inject 2 mg into the skin every 7 (seven) days.   tamsulosin (FLOMAX) 0.4 MG CAPS capsule Take 0.4 mg by mouth daily.   testosterone cypionate (DEPOTESTOTERONE CYPIONATE) 100 MG/ML injection Inject 100 mg into the muscle once a week.     Allergies:   Patient has no known allergies.   Social History   Socioeconomic History   Marital status: Married    Spouse name: Not on file   Number of children: Not on file   Years of education: Not on file   Highest education level: Not on file  Occupational History   Not on file  Tobacco Use   Smoking status: Never   Smokeless tobacco: Never  Vaping Use   Vaping status: Never Used  Substance and Sexual Activity   Alcohol use: Yes    Comment: occasionally   Drug use: No   Sexual activity: Not Currently  Other Topics Concern   Not on file  Social History Narrative   Not on file   Social Determinants of Health   Financial Resource Strain: Not on file  Food Insecurity: Not on file  Transportation Needs: Not on file  Physical Activity: Not on file  Stress: Not on file  Social Connections: Not on file     Family History: The patient's family history includes Aneurysm in his mother; Cancer in his father and mother.  ROS:   Please see the history of present illness.     All other systems reviewed and are negative.  EKGs/Labs/Other Studies Reviewed:    The following studies were reviewed today:   EKG:  12/03/22: Normal sinus rhythm, LVH with repolarization abnormalities, rate 90  Recent Labs: No results found for requested labs within last 365 days.  Recent Lipid Panel    Component Value Date/Time   CHOL 170 04/10/2020 1558   TRIG 182 (H) 04/10/2020 1558   HDL 43 04/10/2020 1558   CHOLHDL 4.0 04/10/2020 1558   LDLCALC 96 04/10/2020 1558     Physical Exam:    VS:  BP 122/64   Pulse 90   Ht 6\' 1"  (1.854 m)   Wt (!) 360 lb 3.2 oz (163.4 kg)   SpO2 96%   BMI 47.52 kg/m     Wt Readings from Last 3 Encounters:  12/03/22 (!) 360 lb 3.2 oz (163.4 kg)  01/28/22 (!) 381 lb (172.8 kg)  11/06/21 (!) 384 lb (174.2 kg)     GEN:  in no acute distress HEENT: Normal NECK:  No JVD appreciated but difficult to assess given body habitus CARDIAC: RRR, 2 out of 6 systolic murmur loudest at RUSB RESPIRATORY:  Clear to auscultation without rales, wheezing or rhonchi  ABDOMEN: Soft, non-tender, non-distended MUSCULOSKELETAL:  No edema; No deformity  SKIN: Warm and dry NEUROLOGIC:  Alert and oriented x 3 PSYCHIATRIC:  Normal affect   ASSESSMENT:    1. Chronic combined systolic (congestive) and diastolic (congestive) heart failure (HCC)   2. S/P TAVR (transcatheter aortic valve replacement)   3. OSA (obstructive sleep apnea)   4. Pain in both lower extremities   5. Morbid obesity (HCC)      PLAN:    Chronic combined systolic and diastolic heart failure: Echocardiogram 11/03/2019 was poor quality study but showed LVEF 35 to 40%, grade 1 diastolic dysfunction, normal RV function, moderate aortic stenosis.  He reports dyspnea with minimal exertion.  Repeat echocardiogram was done on 05/16/2020, with contrast given, and showed LVEF 30 to 35%, normal RV function, moderate to severe aortic stenosis.  LHC 05/25/2020 showed normal coronary arteries.  Echo 06/29/2021 showed EF 55 to 60%, normal RV function, mild perivalvular leak, mean gradient 20 mmHg. -Continue losartan 100 mg daily -Continue carvedilol 12.5 mg twice daily  Aortic stenosis: Moderate on echo 11/03/2019, but poor quality study.  Repeat echo 05/16/20 showed moderate to severe AS (Vmax 4.3 m/s, MG , AVA 1.6 cm^2).  Suspect AVA is overestimated as LVOT diameter is measured at 2.7cm; at LVOT diameter 2.0 cm and VTI 24cm, AVA would be 0.9 cm^2.  Suspect severe AS.  Referred to  valve clinic and underwent TAVR with 29 mm Edwards SAPIEN 3 valve 06/27/2020.  Postop echo showed EF 35 to 40%, no perivalvular leak, aortic valve mean gradient 20 mmHg (likely elevated due to patient prosthesis mismatch due to body habitus).  Echo 06/29/2021 showed EF 55 to 60%, normal RV function, mild perivalvular leak, mean gradient 20 mmHg.  OSA: Reports was diagnosed years ago but not on CPAP.  STOP-BANG 6.  Will check sleep study  Hypertension: On carvedilol, amlodipine, losartan.  Appears controlled  Hyperlipidemia: On atorvastatin 20 mg daily.  LDL 96 on 04/10/2020  Morbid obesity: Body mass index is 47.52 kg/m.  On Ozempic.  Has lost 70 lbs.  Leg pain: check ABIs   RTC in 6 months   Medication Adjustments/Labs and Tests Ordered: Current medicines are reviewed at length with the patient today.  Concerns regarding medicines are outlined above.  Orders Placed This Encounter  Procedures   EKG 12-Lead   VAS [place] ABI WITH/WO TBI   No orders of the defined types were placed in this encounter.   Patient Instructions  Medication Instructions:  Continue all current medication *If you need a refill on your cardiac medications before your next appointment, please call your pharmacy*   Lab Work: None If you have labs (blood work) drawn today and your tests are completely normal, you will receive your results only by: MyChart Message (if you have MyChart) OR A paper copy in the mail If you have any lab test that is abnormal or we need to change your treatment, we will call you to review the results.   Testing/Procedures:  ITMAR Sleep study  Office will call when ready to pick up   Follow-Up: At [facility], you and your health needs are our priority.  As part of our continuing mission to provide you with exceptional heart care, we have created designated Provider Care Teams.  These Care  Teams include your primary Cardiologist (physician) and Advanced Practice Providers  (APPs -  Physician Assistants and Nurse Practitioners) who all work together to provide you with the care you need, when you need it.  We recommend signing up for the patient portal called "MyChart".  Sign up information is provided on this After Visit Summary.  MyChart is used to connect with patients for Virtual Visits (Telemedicine).  Patients are able to view lab/test results, encounter notes, upcoming appointments, etc.  Non-urgent messages can be sent to your provider as well.   To learn more about what you can do with MyChart, go to [email or web address].    Your next appointment:  6 months   call open in Jan 2025 to make appt for April    Provider:  Dr. Bjorn Pippin        Signed, Little Ishikawa, MD  12/08/2022 10:26 PM    [facility]

## 2022-12-03 NOTE — Patient Instructions (Addendum)
Medication Instructions:  Continue all current medication *If you need a refill on your cardiac medications before your next appointment, please call your pharmacy*   Lab Work: None If you have labs (blood work) drawn today and your tests are completely normal, you will receive your results only by: MyChart Message (if you have MyChart) OR A paper copy in the mail If you have any lab test that is abnormal or we need to change your treatment, we will call you to review the results.   Testing/Procedures:  ITMAR Sleep study  Office will call when ready to pick up   Follow-Up: At [facility], you and your health needs are our priority.  As part of our continuing mission to provide you with exceptional heart care, we have created designated Provider Care Teams.  These Care Teams include your primary Cardiologist (physician) and Advanced Practice Providers (APPs -  Physician Assistants and Nurse Practitioners) who all work together to provide you with the care you need, when you need it.  We recommend signing up for the patient portal called "MyChart".  Sign up information is provided on this After Visit Summary.  MyChart is used to connect with patients for Virtual Visits (Telemedicine).  Patients are able to view lab/test results, encounter notes, upcoming appointments, etc.  Non-urgent messages can be sent to your provider as well.   To learn more about what you can do with MyChart, go to [email or web address].    Your next appointment:  6 months   call open in Jan 2025 to make appt for April    Provider:  Dr. Bjorn Pippin

## 2022-12-09 ENCOUNTER — Telehealth: Payer: Self-pay

## 2022-12-09 NOTE — Telephone Encounter (Signed)
Called patient to advise Filomena Jungling can be picked up . Left message forpatient to call office.

## 2022-12-12 ENCOUNTER — Ambulatory Visit (HOSPITAL_COMMUNITY)
Admission: RE | Admit: 2022-12-12 | Discharge: 2022-12-12 | Disposition: A | Discharge status: Home / Self Care | Payer: Medicare Other | Source: Ambulatory Visit | Attending: Cardiology | Admitting: Cardiology

## 2022-12-12 DIAGNOSIS — G4733 Obstructive sleep apnea (adult) (pediatric): Secondary | ICD-10-CM | POA: Insufficient documentation

## 2022-12-12 DIAGNOSIS — M79605 Pain in left leg: Secondary | ICD-10-CM | POA: Insufficient documentation

## 2022-12-12 DIAGNOSIS — Z952 Presence of prosthetic heart valve: Secondary | ICD-10-CM | POA: Diagnosis present

## 2022-12-12 DIAGNOSIS — M79604 Pain in right leg: Secondary | ICD-10-CM | POA: Diagnosis present

## 2022-12-12 LAB — VAS US ABI WITH/WO TBI
Left ABI: 0.96
Right ABI: 1.02

## 2022-12-27 ENCOUNTER — Other Ambulatory Visit: Payer: Self-pay | Admitting: Cardiology

## 2023-01-02 IMAGING — MG DIGITAL DIAGNOSTIC BILAT W/ TOMO W/ CAD
8 series · 8 of 24 positions shown · non-contrast
Comparison: None.

ACR Breast Density Category a: The breast tissue is almost entirely
fatty.

CLINICAL DATA: Patient presents for a bilateral diagnostic exam due
to a 2 month history central left retroareolar/nipple
tenderness/sensitivity. No focal palpable abnormality.

EXAM:
DIGITAL DIAGNOSTIC BILATERAL MAMMOGRAM WITH TOMOSYNTHESIS AND CAD
TECHNIQUE: Bilateral digital diagnostic mammography and breast tomosynthesis
was performed. The images were evaluated with computer-aided
detection.

[L CC synth-2D]
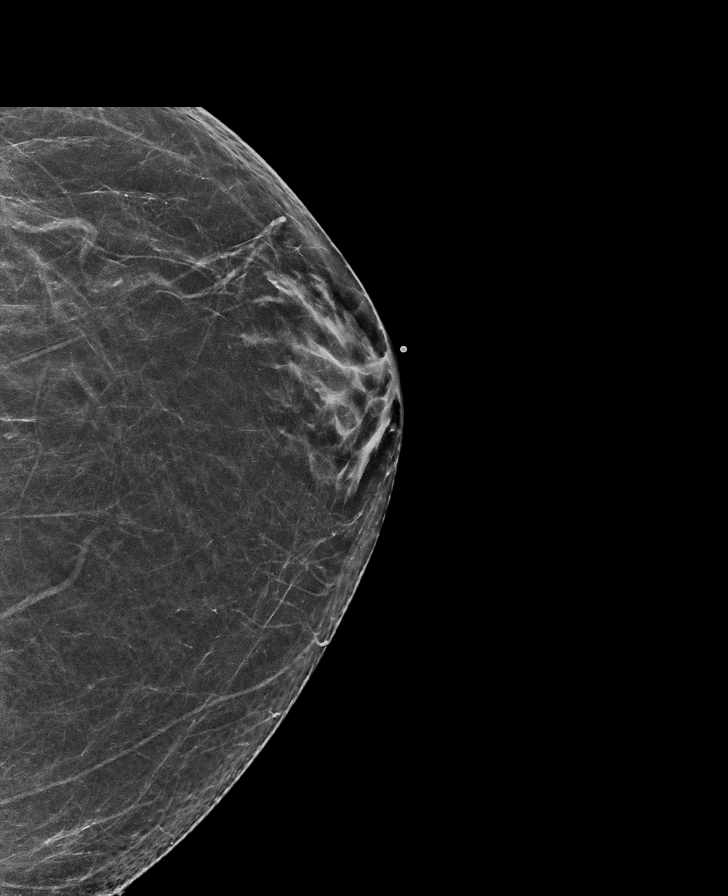

[L MLO synth-2D]
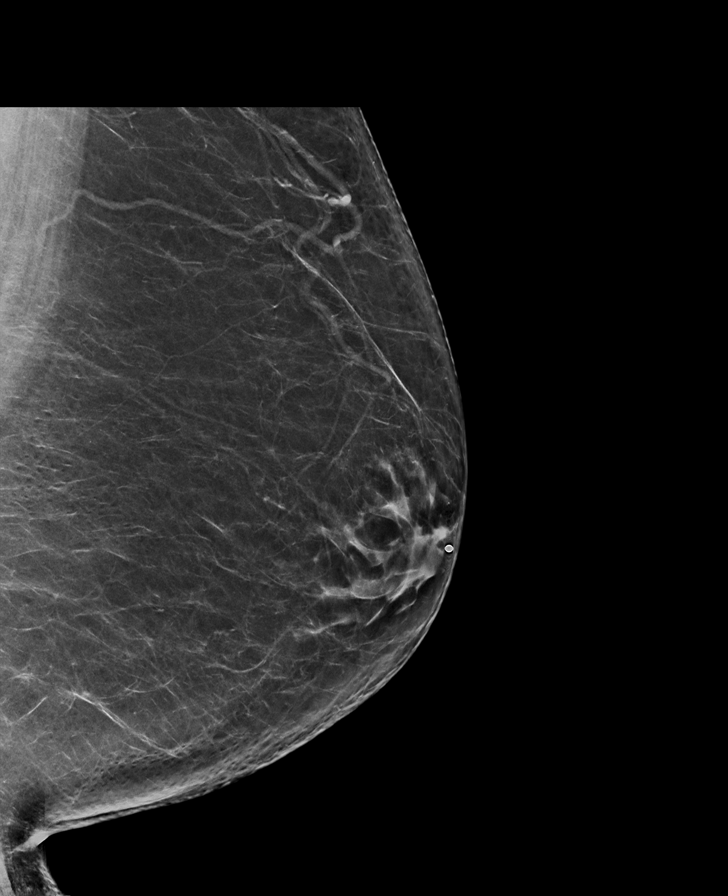

[R CC synth-2D]
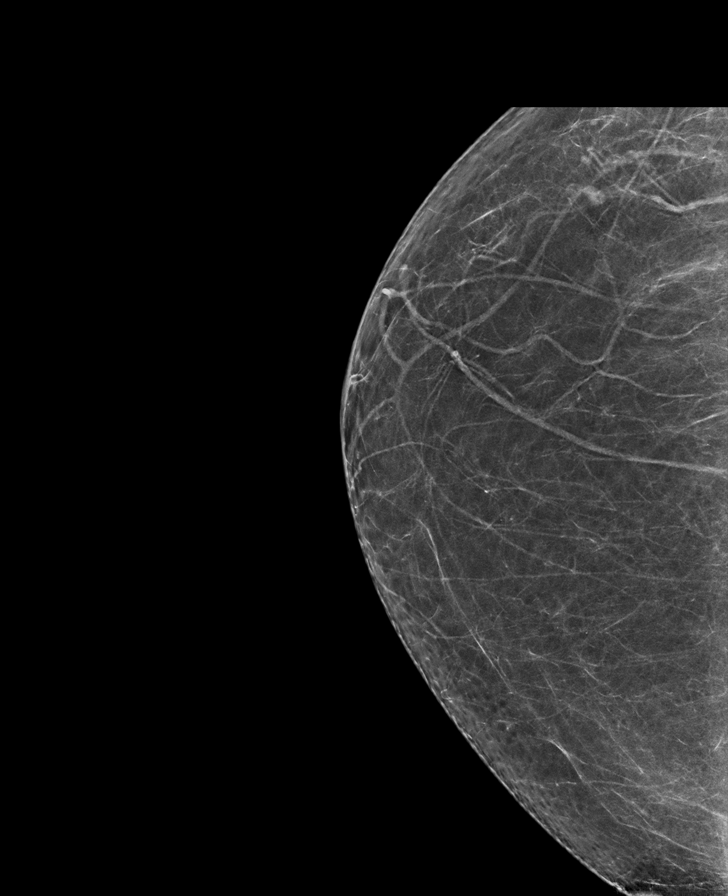

[R MLO synth-2D]
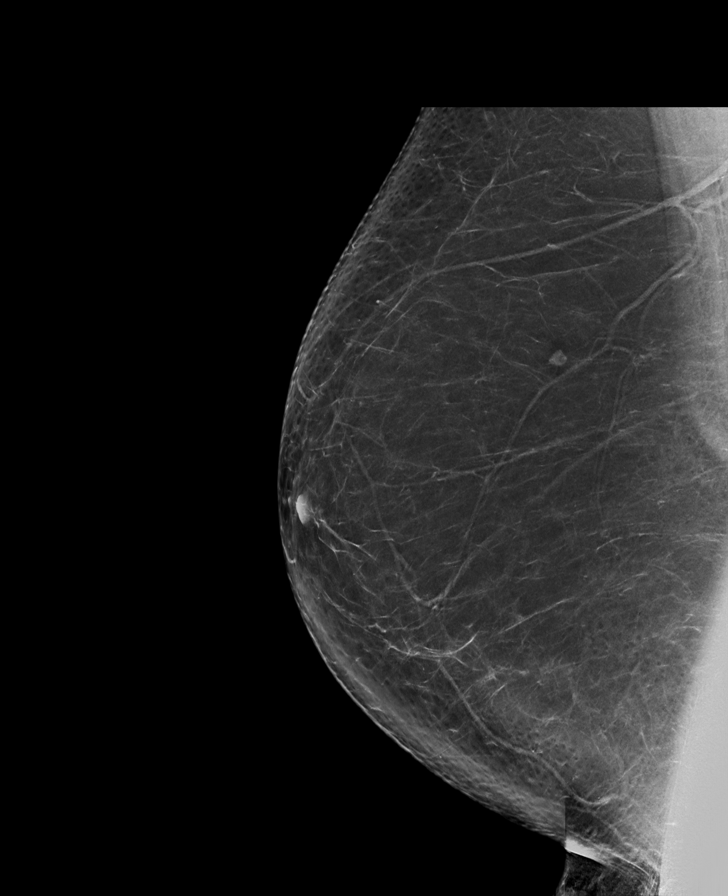

[L CC tomo · tomo slice 35/70.0]
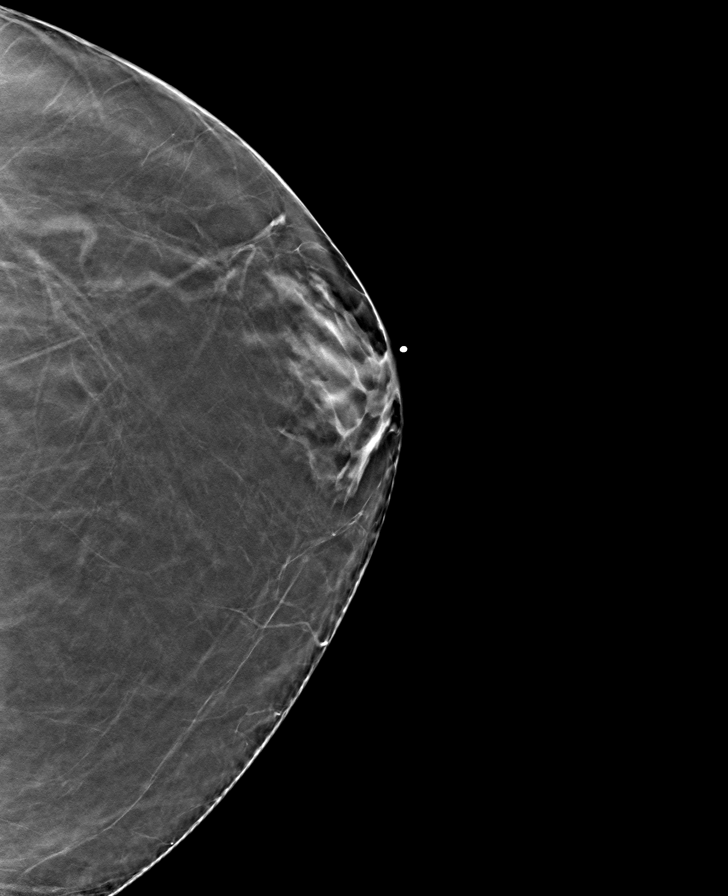

[R MLO tomo · tomo slice 48/95.0]
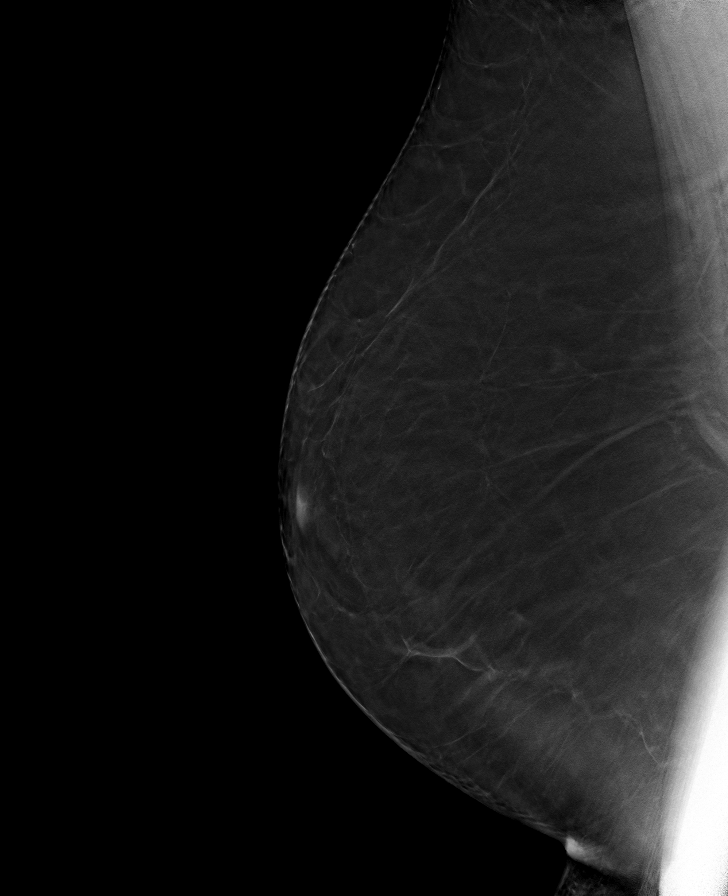

[R CC tomo · tomo slice 39/78.0]
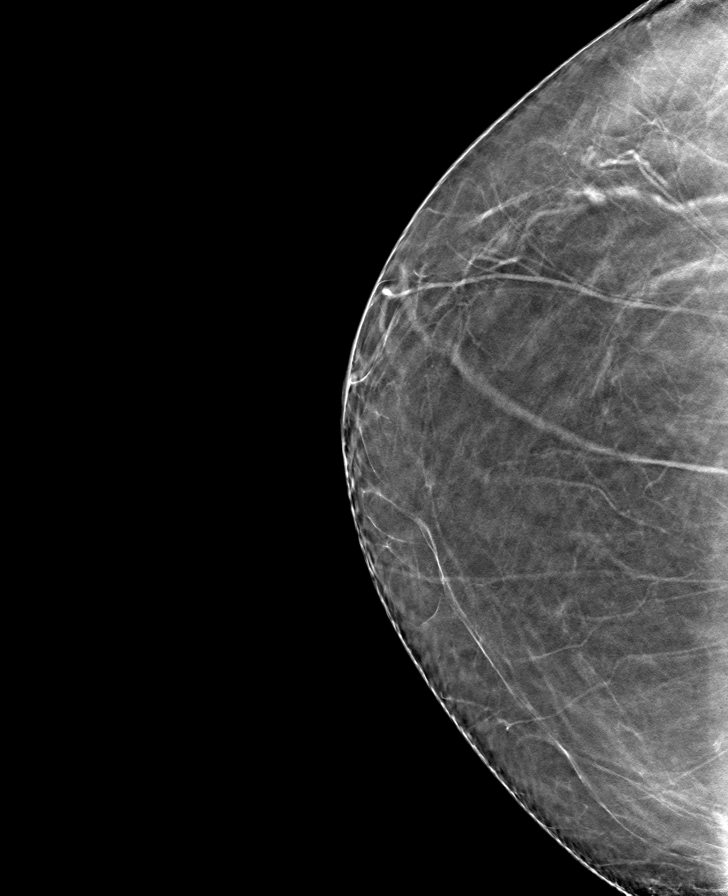

[L MLO tomo · tomo slice 44/87.0]
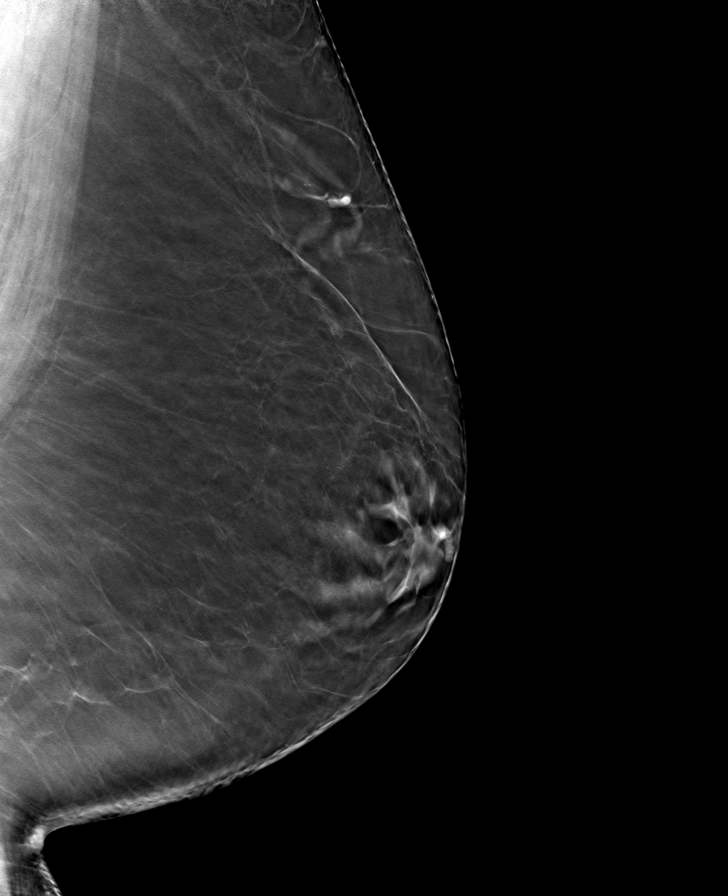

[8 of 24 positions shown; findings below may reference images not displayed]

FINDINGS: Exam demonstrates mild fibrofatty density in the central left
retroareolar region typical in appearance for gynecomastia correlate
to patient's presenting symptoms. Remainder of the left breast as
well as right breast is normal.
IMPRESSION: Evidence of mild left-sided gynecomastia.

RECOMMENDATION:
Recommend clinical correlation as to the possible etiology of this
gynecomastia. Otherwise, recommend continued management on a
clinical basis.

I have discussed the findings and recommendations with the patient.
If applicable, a reminder letter will be sent to the patient
regarding the next appointment.

BI-RADS CATEGORY  2: Benign.

## 2023-01-03 ENCOUNTER — Telehealth: Payer: Self-pay | Admitting: Pharmacist

## 2023-01-03 MED ORDER — OZEMPIC (2 MG/DOSE) 8 MG/3ML ~~LOC~~ SOPN: 2.0000 mg | PEN_INJECTOR | SUBCUTANEOUS | 6 refills | Status: DC

## 2023-01-03 NOTE — Telephone Encounter (Signed)
Lost 73 lbs so far and tolerates Ozempic 2 mg once week dose well. Requesting more refills

## 2023-02-13 IMAGING — CR DG CHEST 2V
2 series · 2 of 2 positions shown · non-contrast
Comparison: CT a chest dated June 05, 2020.

CLINICAL DATA: Preoperative study for TAVR.

EXAM:
CHEST - 2 VIEW

[w chest pa]
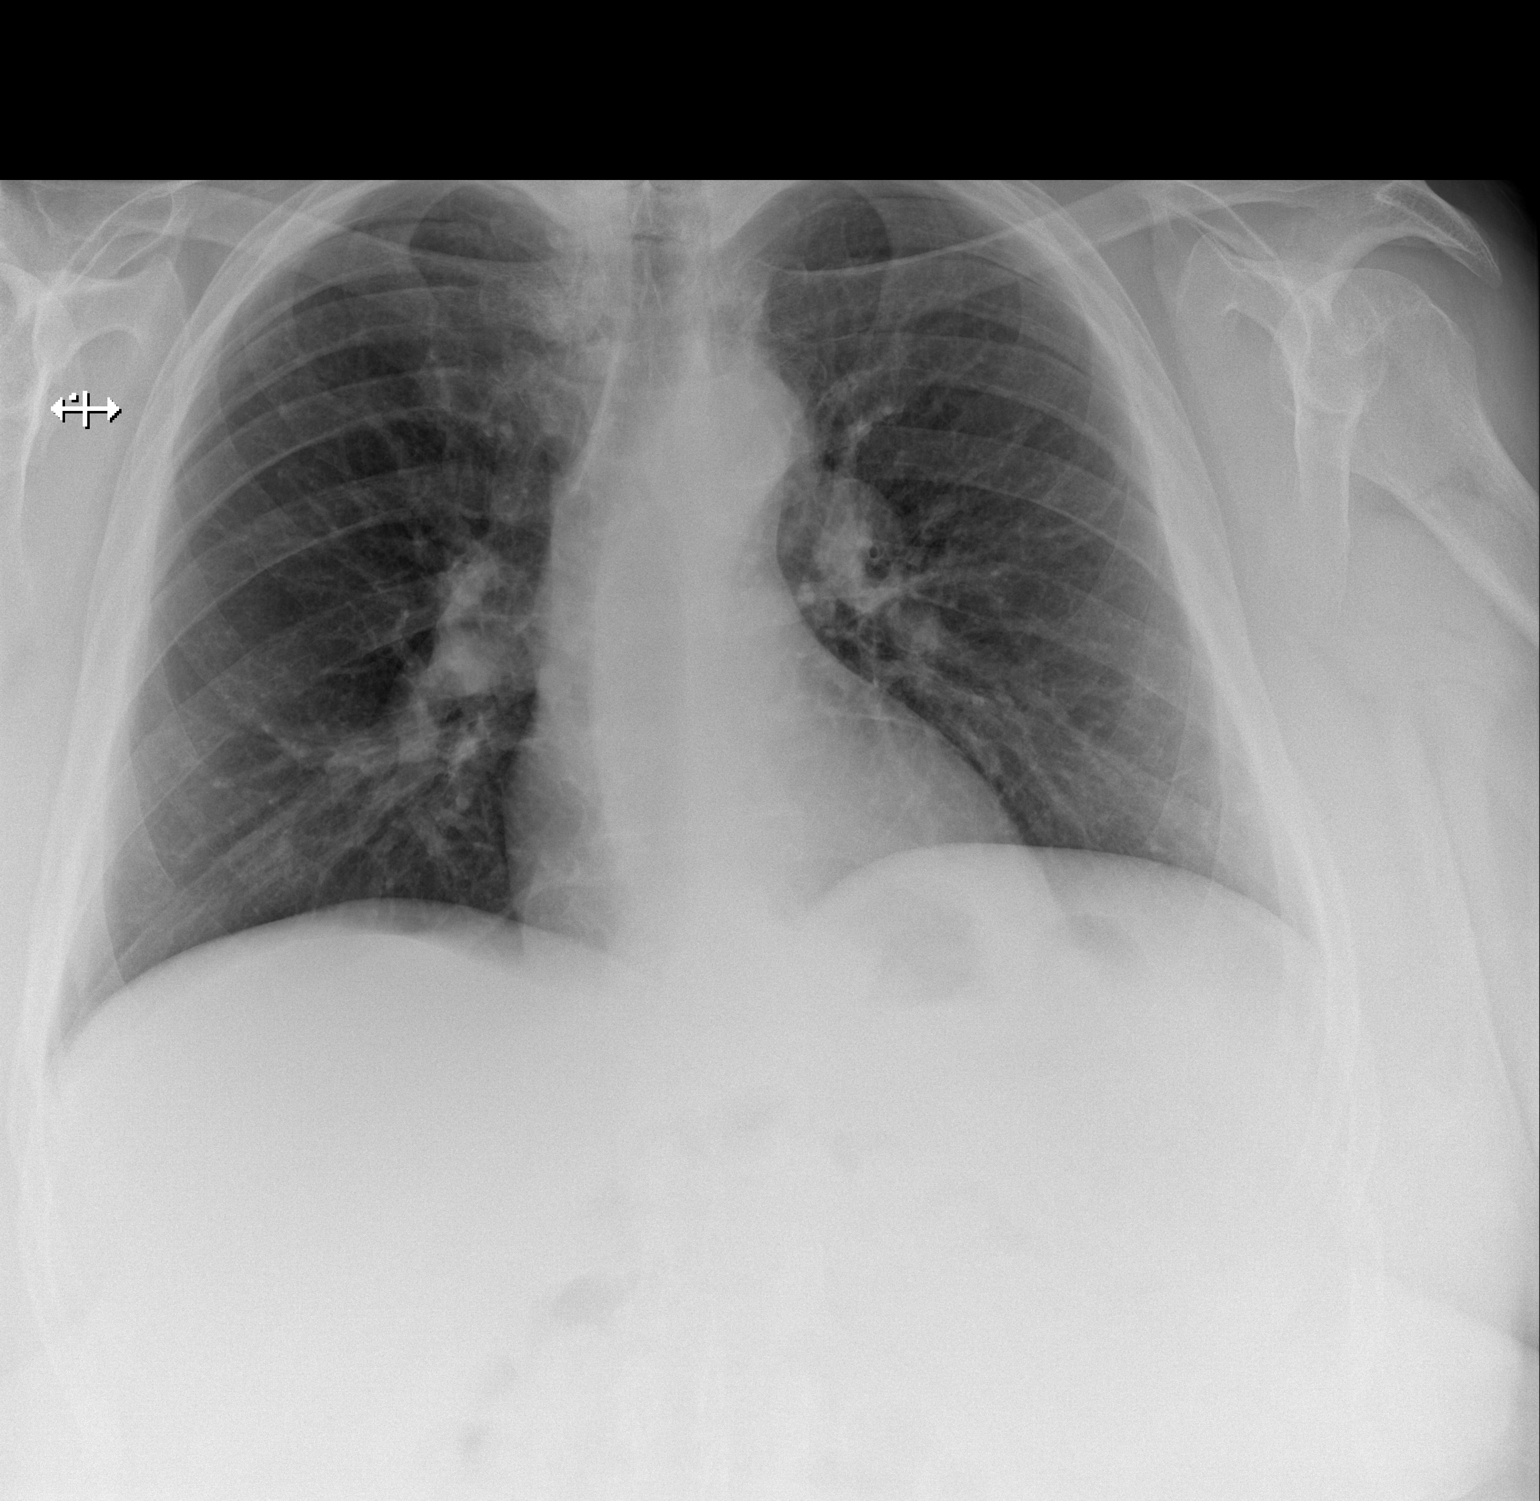

[w chest lat]
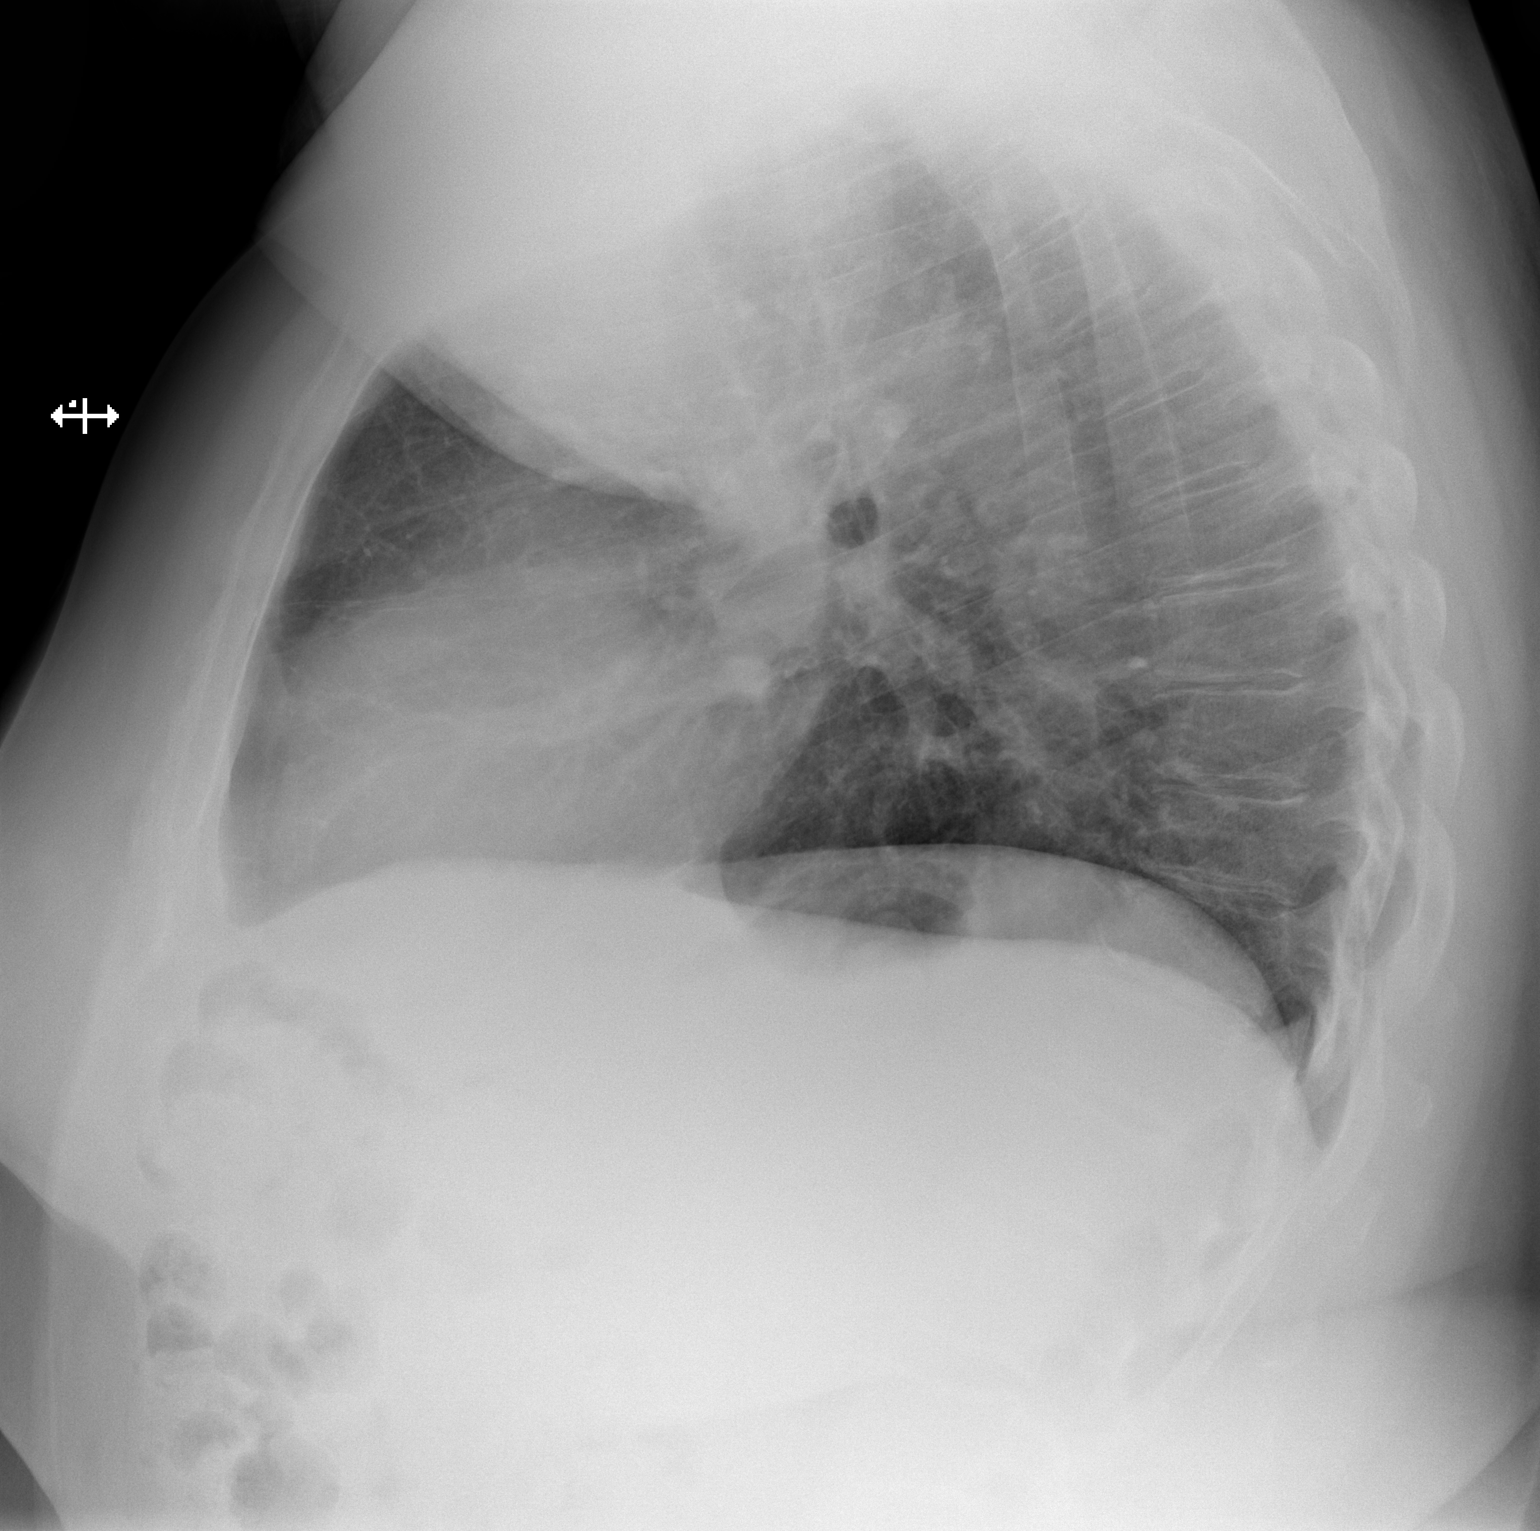

[2 of 2 positions shown; findings below may reference images not displayed]

FINDINGS: The heart size and mediastinal contours are within normal limits.
Both lungs are clear. The visualized skeletal structures are
unremarkable.
IMPRESSION: No active cardiopulmonary disease.

## 2023-02-14 ENCOUNTER — Other Ambulatory Visit: Payer: Self-pay | Admitting: Cardiology

## 2023-02-20 ENCOUNTER — Telehealth: Payer: Self-pay | Admitting: Pharmacy Technician

## 2023-02-20 ENCOUNTER — Other Ambulatory Visit (HOSPITAL_COMMUNITY): Payer: Self-pay

## 2023-02-20 NOTE — Telephone Encounter (Signed)
Pharmacy Patient Advocate Encounter   Received notification from CoverMyMeds that prior authorization for Ozempic (2mg /dose) 8mg /49ml is required/requested.   Insurance verification completed.   The patient is insured through [facility] .   Per test claim: Refill too soon. PA is not needed at this time. Medication was filled 02/14/23. Next eligible fill date is 03/07/23.

## 2023-03-31 ENCOUNTER — Telehealth: Payer: Self-pay

## 2023-03-31 NOTE — Telephone Encounter (Signed)
Ordering provider: Bjorn Pippin Associated diagnoses: I50.20, I10, I35.0 WatchPAT PA obtained on 03/31/2023 by Brunetta Genera, LPN. Authorization: Yes; tracking ID  [id number]. Patient notified of PIN (1234) on 03/31/2023 via Notification Method: phone.  Phone note routed to covering staff for follow-up.  Instructions for covering staff:  Please contact patient in 2 weeks if WatchPAT study results are not available yet. Remind patient to complete test.  If patient declines to proceed with test, please confirm that box is unopened and remind patient to return it to the office within 30 days. Route phone note to [employer] pool for tracking.  If box has been opened, please route phone note to [employer] pool to have device de-initialized and processed for billing.

## 2023-04-08 ENCOUNTER — Other Ambulatory Visit: Payer: Self-pay | Admitting: Cardiology

## 2023-07-07 ENCOUNTER — Other Ambulatory Visit: Payer: Self-pay | Admitting: Cardiology

## 2023-07-07 NOTE — Telephone Encounter (Signed)
 Spoke with patient as he has not yet completed testing with WatchPAT device. Pt reports he has the device in a box somewhere and will work to find it as he recently moved. He still wishes to complete this test. Advised pt that we will work on precerting the test and follow-up with him next week. Direct number provided. Pt expressed appreciation of call and denies questions at this time.

## 2023-07-08 ENCOUNTER — Telehealth: Payer: Self-pay

## 2023-07-08 DIAGNOSIS — I10 Essential (primary) hypertension: Secondary | ICD-10-CM

## 2023-07-08 DIAGNOSIS — I5042 Chronic combined systolic (congestive) and diastolic (congestive) heart failure: Secondary | ICD-10-CM

## 2023-07-08 NOTE — Telephone Encounter (Signed)
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 Authorization: Per the [facility] Provider Portal. Prior Authorization/Notification is not required for the requested service(s) Decision ID #: [id number]  Patient notified of PIN ([phone]) on 07/08/2023 [place] Notification Method: phone. No answer so I left a message on the pts VM (Ok per [employer] [facility] [employer]) advising the pt that [facility] did not require a prior authorization for this sleep study and that it is ok for him to do his sleep test now. I did leave the WatchPAT One-HST device Pin # in the message as well.  I also sent the pt a [facility] message with the Pin # as well.  Phone note routed to covering staff for follow-up.

## 2023-07-08 NOTE — Telephone Encounter (Signed)
**Note De-Identified Boneta Standre Obfuscation** See phone note from 5/13 for update on Itamar-HST approval.

## 2023-07-11 NOTE — Telephone Encounter (Signed)
 LMOVM (DPR) asking if patient was able to find the [facility] device. Advised that it has been pre-authorized by his insurance company. Provided direct number for callback.

## 2023-08-07 NOTE — Telephone Encounter (Signed)
 Second attempt:  LMOVM (DPR) requesting call back regarding WatchPAT HST. Direct number provided.

## 2023-09-03 ENCOUNTER — Other Ambulatory Visit: Payer: Self-pay | Admitting: Cardiology

## 2023-12-08 ENCOUNTER — Encounter: Payer: Self-pay | Admitting: *Deleted

## 2023-12-08 NOTE — Telephone Encounter (Signed)
 Letter mailed on 12/08/23.
